# Patient Record
Sex: Male | Born: 1980 | Race: White | Hispanic: No | Marital: Married | Smoking: Light tobacco smoker
Health system: Southern US, Community
[De-identification: ages and names within clinical notes are randomized; demographics above are authoritative.]

## PROBLEM LIST (undated history)

## (undated) DIAGNOSIS — G43909 Migraine, unspecified, not intractable, without status migrainosus: Secondary | ICD-10-CM

---

## 2015-07-29 ENCOUNTER — Encounter (HOSPITAL_COMMUNITY): Payer: Self-pay

## 2015-07-29 ENCOUNTER — Emergency Department (HOSPITAL_COMMUNITY)
Admission: EM | Admit: 2015-07-29 | Discharge: 2015-07-29 | Disposition: A | Discharge status: Home / Self Care | Payer: Medicaid Other | Attending: Emergency Medicine | Admitting: Emergency Medicine

## 2015-07-29 DIAGNOSIS — S0502XA Injury of conjunctiva and corneal abrasion without foreign body, left eye, initial encounter: Secondary | ICD-10-CM | POA: Diagnosis not present

## 2015-07-29 DIAGNOSIS — Y9389 Activity, other specified: Secondary | ICD-10-CM | POA: Insufficient documentation

## 2015-07-29 DIAGNOSIS — Y998 Other external cause status: Secondary | ICD-10-CM | POA: Insufficient documentation

## 2015-07-29 DIAGNOSIS — W228XXA Striking against or struck by other objects, initial encounter: Secondary | ICD-10-CM | POA: Insufficient documentation

## 2015-07-29 DIAGNOSIS — Y9289 Other specified places as the place of occurrence of the external cause: Secondary | ICD-10-CM | POA: Diagnosis not present

## 2015-07-29 DIAGNOSIS — S0592XA Unspecified injury of left eye and orbit, initial encounter: Secondary | ICD-10-CM | POA: Diagnosis present

## 2015-07-29 MED ORDER — FLUORESCEIN SODIUM 1 MG OP STRP
1.0000 | ORAL_STRIP | Freq: Once | OPHTHALMIC | Status: DC
  Filled 2015-07-29: qty 1

## 2015-07-29 MED ORDER — OXYCODONE-ACETAMINOPHEN 5-325 MG PO TABS
1.0000 | ORAL_TABLET | Freq: Once | ORAL | Status: DC
  Filled 2015-07-29: qty 1

## 2015-07-29 MED ORDER — TETRACAINE HCL 0.5 % OP SOLN
2.0000 [drp] | Freq: Once | OPHTHALMIC | Status: AC
  Administered 2015-07-29: 2 [drp] via OPHTHALMIC
  Filled 2015-07-29: qty 2

## 2015-07-29 MED ORDER — ERYTHROMYCIN 5 MG/GM OP OINT
1.0000 "application " | TOPICAL_OINTMENT | Freq: Three times a day (TID) | OPHTHALMIC | Status: DC
  Administered 2015-07-29: 1 via OPHTHALMIC
  Filled 2015-07-29: qty 3.5

## 2015-07-29 NOTE — Discharge Instructions (Signed)
Corneal Abrasion °The cornea is the clear covering at the front and center of the eye. When looking at the colored portion of the eye (iris), you are looking through the cornea. This very thin tissue is made up of many layers. The surface layer is a single layer of cells (corneal epithelium) and is one of the most sensitive tissues in the body. If a scratch or injury causes the corneal epithelium to come off, it is called a corneal abrasion. If the injury extends to the tissues below the epithelium, the condition is called a corneal ulcer. °CAUSES  °· Scratches. °· Trauma. °· Foreign body in the eye. °Some people have recurrences of abrasions in the area of the original injury even after it has healed (recurrent erosion syndrome). Recurrent erosion syndrome generally improves and goes away with time. °SYMPTOMS  °· Eye pain. °· Difficulty or inability to keep the injured eye open. °· The eye becomes very sensitive to light. °· Recurrent erosions tend to happen suddenly, first thing in the morning, usually after waking up and opening the eye. °DIAGNOSIS  °Your health care provider can diagnose a corneal abrasion during an eye exam. Dye is usually placed in the eye using a drop or a small paper strip moistened by your tears. When the eye is examined with a special light, the abrasion shows up clearly because of the dye. °TREATMENT  °· Small abrasions may be treated with antibiotic drops or ointment alone. °· A pressure patch may be put over the eye. If this is done, follow your doctor's instructions for when to remove the patch. Do not drive or use machines while the eye patch is on. Judging distances is hard to do with a patch on. °If the abrasion becomes infected and spreads to the deeper tissues of the cornea, a corneal ulcer can result. This is serious because it can cause corneal scarring. Corneal scars interfere with light passing through the cornea and cause a loss of vision in the involved eye. °HOME CARE  INSTRUCTIONS °· Use medicine or ointment as directed. Only take over-the-counter or prescription medicines for pain, discomfort, or fever as directed by your health care provider. °· Do not drive or operate machinery if your eye is patched. Your ability to judge distances is impaired. °· If your health care provider has given you a follow-up appointment, it is very important to keep that appointment. Not keeping the appointment could result in a severe eye infection or permanent loss of vision. If there is any problem keeping the appointment, let your health care provider know. °SEEK MEDICAL CARE IF:  °· You have pain, light sensitivity, and a scratchy feeling in one eye or both eyes. °· Your pressure patch keeps loosening up, and you can blink your eye under the patch after treatment. °· Any kind of discharge develops from the eye after treatment or if the lids stick together in the morning. °· You have the same symptoms in the morning as you did with the original abrasion days, weeks, or months after the abrasion healed. °MAKE SURE YOU:  °· Understand these instructions. °· Will watch your condition. °· Will get help right away if you are not doing well or get worse. °Document Released: 10/10/2000 Document Revised: 10/18/2013 Document Reviewed: 06/20/2013 °ExitCare® Patient Information ©2015 [employer]. This information is not intended to replace advice given to you by your health care provider. Make sure you discuss any questions you have with your health care provider. ° °

## 2015-07-29 NOTE — ED Provider Notes (Signed)
CSN: [id number]     Arrival date & time 07/29/15  1959 History   First MD Initiated Contact with Patient 07/29/15 2007     Chief Complaint  Patient presents with  . Eye Injury     (Consider location/radiation/quality/duration/timing/severity/associated sxs/prior Treatment) HPI Comments: Pt states that he was stringing a guitar and the string popped and now he is having pain, tearing and vision seems a little blurry. Doesn't wear corrective lenses.  Patient is a 34 y.o. male presenting with eye injury. The history is provided by the patient. No language interpreter was used.  Eye Injury This is a new problem. The current episode started today. The problem occurs constantly. The problem has been unchanged. Pertinent negatives include no fever or headaches. Exacerbated by: light. He has tried nothing for the symptoms.    History reviewed. No pertinent past medical history. History reviewed. No pertinent past surgical history. No family history on file. Social History  Substance Use Topics  . Smoking status: Never Smoker   . Smokeless tobacco: None  . Alcohol Use: Yes     Comment: socially    Review of Systems  Constitutional: Negative for fever.  Neurological: Negative for headaches.  All other systems reviewed and are negative.     Allergies  Review of patient's allergies indicates no known allergies.  Home Medications   Prior to Admission medications   Not on File   BP 122/86 mmHg  Pulse 88  Temp(Src) 97.9 F (36.6 C) (Oral)  Resp 16  Ht  (1.854 m)  Wt 236 lb 1.6 oz (107.094 kg)  BMI 31.16 kg/m2  SpO2 98% Physical Exam  Constitutional: He is oriented to person, place, and time. He appears well-developed and well-nourished.  HENT:  Head: Normocephalic.  Eyes: Conjunctivae and EOM are normal. Pupils are equal, round, and reactive to light.  Slit lamp exam:      The left eye shows corneal abrasion and fluorescein uptake.    Cardiovascular: Normal rate  and regular rhythm.   Pulmonary/Chest: Breath sounds normal.  Musculoskeletal: Normal range of motion.  Neurological: He is alert and oriented to person, place, and time.  Nursing note and vitals reviewed.   ED Course  Procedures (including critical care time) Labs Review Labs Reviewed - No data to display  Imaging Review No results found. I have personally reviewed and evaluated these images and lab results as part of my medical decision-making.   EKG Interpretation None      MDM   Final diagnoses:  Corneal abrasion, left, initial encounter    Exam consistent with corneal abrasion. Pt given erythromycin here. Pt given optho follow up as needed.   Teressa Lower, NP 07/29/15 [id number]  Pricilla Loveless, MD 07/30/15 0003

## 2015-07-29 NOTE — ED Notes (Signed)
Pt was restringing a guitar and a string popped back up and hit him in his left eye. Happened about 1815. Has had ice on it and it has started getting blurry and swollen.

## 2015-07-29 NOTE — ED Notes (Signed)
See fnps notes she saw before myself  Pt alert  No distress

## 2017-03-25 ENCOUNTER — Emergency Department (HOSPITAL_COMMUNITY): Payer: 59

## 2017-03-25 ENCOUNTER — Encounter (HOSPITAL_COMMUNITY): Payer: Self-pay | Admitting: Emergency Medicine

## 2017-03-25 ENCOUNTER — Emergency Department (HOSPITAL_COMMUNITY)
Admission: EM | Admit: 2017-03-25 | Discharge: 2017-03-25 | Disposition: A | Discharge status: Home / Self Care | Payer: 59 | Attending: Emergency Medicine | Admitting: Emergency Medicine

## 2017-03-25 DIAGNOSIS — R1032 Left lower quadrant pain: Secondary | ICD-10-CM | POA: Diagnosis present

## 2017-03-25 DIAGNOSIS — N433 Hydrocele, unspecified: Secondary | ICD-10-CM

## 2017-03-25 DIAGNOSIS — I861 Scrotal varices: Secondary | ICD-10-CM | POA: Insufficient documentation

## 2017-03-25 DIAGNOSIS — R300 Dysuria: Secondary | ICD-10-CM | POA: Diagnosis not present

## 2017-03-25 HISTORY — DX: Migraine, unspecified, not intractable, without status migrainosus: G43.909

## 2017-03-25 LAB — COMPREHENSIVE METABOLIC PANEL
ALK PHOS: 56 U/L (ref 38–126)
ALT: 34 U/L (ref 17–63)
ANION GAP: 9 (ref 5–15)
AST: 29 U/L (ref 15–41)
Albumin: 4.9 g/dL (ref 3.5–5.0)
BILIRUBIN TOTAL: 0.6 mg/dL (ref 0.3–1.2)
BUN: 14 mg/dL (ref 6–20)
CO2: 28 mmol/L (ref 22–32)
Calcium: 9.5 mg/dL (ref 8.9–10.3)
Chloride: 101 mmol/L (ref 101–111)
Creatinine, Ser: 1.07 mg/dL (ref 0.61–1.24)
GFR calc Af Amer: >60 mL/min (ref >60)
GFR calc non Af Amer: >60 mL/min (ref >60)
GLUCOSE: 95 mg/dL (ref 65–99)
POTASSIUM: 4.3 mmol/L (ref 3.5–5.1)
Sodium: 138 mmol/L (ref 135–145)
TOTAL PROTEIN: 7.9 g/dL (ref 6.5–8.1)

## 2017-03-25 LAB — URINALYSIS, ROUTINE W REFLEX MICROSCOPIC
BACTERIA UA: NONE SEEN
BILIRUBIN URINE: NEGATIVE
Glucose, UA: 50 mg/dL — AB
KETONES UR: NEGATIVE mg/dL
LEUKOCYTES UA: NEGATIVE
NITRITE: NEGATIVE
PROTEIN: NEGATIVE mg/dL
SPECIFIC GRAVITY, URINE: 1.004 — AB (ref 1.005–1.030)
Squamous Epithelial / LPF: NONE SEEN
pH: 6 (ref 5.0–8.0)

## 2017-03-25 LAB — CBC
HEMATOCRIT: 45.9 % (ref 39.0–52.0)
HEMOGLOBIN: 16.6 g/dL (ref 13.0–17.0)
MCH: 30.7 pg (ref 26.0–34.0)
MCHC: 36.2 g/dL — AB (ref 30.0–36.0)
MCV: 84.8 fL (ref 78.0–100.0)
Platelets: 261 10*3/uL (ref 150–400)
RBC: 5.41 MIL/uL (ref 4.22–5.81)
RDW: 12.6 % (ref 11.5–15.5)
WBC: 8.6 10*3/uL (ref 4.0–10.5)

## 2017-03-25 LAB — LIPASE, BLOOD: LIPASE: 22 U/L (ref 11–51)

## 2017-03-25 NOTE — ED Provider Notes (Signed)
Received transfer of care from Fayrene Helperran, [place], PA-C at end of shift pending a renal ultrasound with plan to discharge home with PCP follow-up. Hydrocele and varicocele on testicular US. Normal doppler. Ultrasound renal shows no hydronephrosis or urethral stones. Discussed results and symptomatic management with patient. Discharge home with urology follow up.  Patient was stable and comfortable prior to discharge.     Georgiana ShoreMitchell, Lizzy Hamre B, PA-C 03/25/17 1706    Raeford RazorKohut, Stephen, MD 03/29/17 2048

## 2017-03-25 NOTE — ED Triage Notes (Signed)
Pt c/o lower abdominal pain since yesterday. Pt states pain in LLQ present with urination. Pt reports lower abdomen is tender at rest, last bowel movement this morning, denies diarrhea, denies vomiting, denies blood in urine. [facility] and advised to come to ED for evaluation. Pt also c/o migraine type pain in head with tingling in hands bilaterally.

## 2017-03-25 NOTE — ED Provider Notes (Signed)
[facility]-EMERGENCY DEPT Provider Note   CSN: [id number] Arrival date & time: 03/25/17  1150     History   Chief Complaint Chief Complaint  Patient presents with  . Abdominal Pain  . Dysuria    HPI Alan Bauer is a 36 y.o. male.  HPI   36 year old male with history of migraine Complaint of left groin pain. Patient states for the past 36 hours he has had a dull pain to his left groin. Pain is mild, persistent, with increased discomfort while urinating. Currently rates pain as 2 out of 10. He did notice some mild increase in urine odor. Denies any associated fever, chills or chest pain, short of breath, productive cough, urinary frequency or urgency, penile discharge, hematuria, bowel bladder problem. Have a normal bowel movement yesterday. Has been drinking fluid and cranberry juice to help aid with symptoms. No prior history of kidney stones, STI, or change in sex partners. Furthermore, history of migraine, does endorse mild headache and mild tingling sensation to both hands, he has had this test symptoms with past migraine. Patient went to urgent care today for his symptoms but was recommended to come to ER for further evaluation.    Past Medical History:  Diagnosis Date  . Migraines     There are no active problems to display for this patient.   History reviewed. No pertinent surgical history.     Home Medications    Prior to Admission medications   Not on File    Family History History reviewed. No pertinent family history.  Social History Social History  Substance Use Topics  . Smoking status: Never Smoker  . Smokeless tobacco: Never Used  . Alcohol use Yes     Comment: socially     Allergies   Patient has no known allergies.   Review of Systems Review of Systems  All other systems reviewed and are negative.    Physical Exam Updated Vital Signs BP 136/85 (BP Location: Right Arm)   Pulse 79   Temp 98 F (36.7 C) (Oral)   Resp 14   Ht 6\' 1"  (1.854  m)   Wt 104.3 kg (230 lb)   SpO2 100%   BMI 30.34 kg/m   Physical Exam  Constitutional: He appears well-developed and well-nourished. No distress.  HENT:  Head: Atraumatic.  Eyes: Conjunctivae are normal.  Neck: Neck supple.  No nuchal rigidity  Cardiovascular: Normal rate and regular rhythm.   Pulmonary/Chest: Effort normal and breath sounds normal.  Abdominal: Soft. There is tenderness (Very mild tenderness to left inguinal region without deformity).  No CVA tenderness  Genitourinary:  Genitourinary Comments: Chaperone present during exam. No inguinal lymphadenopathy or inguinal hernia noted. No more circumcised penis review of lesion or rash. Testicles with normal lie bilaterally. Mild tenderness to left testicle on palpation without any swelling or edema. Scrotum is soft, perineum is soft. No rash.  Neurological: He is alert. He has normal strength. No cranial nerve deficit or sensory deficit. GCS eye subscore is 4. GCS verbal subscore is 5. GCS motor subscore is 6.  Skin: No rash noted.  Psychiatric: He has a normal mood and affect.  Nursing note and vitals reviewed.    ED Treatments / Results  Labs (all labs ordered are listed, but only abnormal results are displayed) Labs Reviewed  CBC - Abnormal; Notable for the following:       Result Value   MCHC 36.2 (*)    All other components within normal limits  URINALYSIS,  ROUTINE W REFLEX MICROSCOPIC - Abnormal; Notable for the following:    Color, Urine STRAW (*)    Specific Gravity, Urine 1.004 (*)    Glucose, UA 50 (*)    Hgb urine dipstick SMALL (*)    All other components within normal limits  LIPASE, BLOOD  COMPREHENSIVE METABOLIC PANEL    EKG  EKG Interpretation None       Radiology US Scrotum  Result Date: 03/25/2017 CLINICAL DATA:  Initial evaluation for acute left inguinal, left testicular pain. EXAM: SCROTAL ULTRASOUND DOPPLER ULTRASOUND OF THE TESTICLES TECHNIQUE: Complete ultrasound examination of  the testicles, epididymis, and other scrotal structures was performed. Color and spectral Doppler ultrasound were also utilized to evaluate blood flow to the testicles. COMPARISON:  None. FINDINGS: Right testicle Measurements: 4.6 x 2.5 x 3.9 cm. No mass or microlithiasis visualized. Left testicle Measurements: 4.5 x 2.3 x 3.9 cm. No mass or microlithiasis visualized. Right epididymis:  Normal in size and appearance. Left epididymis: Normal in size and appearance. 2.3 x 2 mm anechoic lesion most consistent with a small cyst. Hydrocele:  Small moderate left-sided hydrocele. Varicocele:  Left-sided varicocele. Pulsed Doppler interrogation of both testes demonstrates normal low resistance arterial and venous waveforms bilaterally. IMPRESSION: 1. Left-sided varicocele. 2. Small to moderate left-sided hydrocele. 3. Otherwise unremarkable testicular ultrasound. Electronically Signed   By: Rise Mu M.D.   On: 03/25/2017 16:10   [place] Art/ven Flow Abd Pelv Doppler  Result Date: 03/25/2017 CLINICAL DATA:  Initial evaluation for acute left inguinal, left testicular pain. EXAM: SCROTAL ULTRASOUND DOPPLER ULTRASOUND OF THE TESTICLES TECHNIQUE: Complete ultrasound examination of the testicles, epididymis, and other scrotal structures was performed. Color and spectral Doppler ultrasound were also utilized to evaluate blood flow to the testicles. COMPARISON:  None. FINDINGS: Right testicle Measurements: 4.6 x 2.5 x 3.9 cm. No mass or microlithiasis visualized. Left testicle Measurements: 4.5 x 2.3 x 3.9 cm. No mass or microlithiasis visualized. Right epididymis:  Normal in size and appearance. Left epididymis: Normal in size and appearance. 2.3 x 2 mm anechoic lesion most consistent with a small cyst. Hydrocele:  Small moderate left-sided hydrocele. Varicocele:  Left-sided varicocele. Pulsed Doppler interrogation of both testes demonstrates normal low resistance arterial and venous waveforms bilaterally. IMPRESSION:  1. Left-sided varicocele. 2. Small to moderate left-sided hydrocele. 3. Otherwise unremarkable testicular ultrasound. Electronically Signed   By: Rise Mu M.D.   On: 03/25/2017 16:10    Procedures Procedures (including critical care time)  Medications Ordered in ED Medications - No data to display   Initial Impression / Assessment and Plan / ED Course  I have reviewed the triage vital signs and the nursing notes.  Pertinent labs & imaging results that were available during my care of the patient were reviewed by me and considered in my medical decision making (see chart for details).     BP 125/83 (BP Location: Right Arm)   Pulse 77   Temp 98 F (36.7 C) (Oral)   Resp 12   Ht 6\' 1"  (1.854 m)   Wt 104.3 kg (230 lb)   SpO2 97%   BMI 30.34 kg/m    Final Clinical Impressions(s) / ED Diagnoses   Final diagnoses:  None    New Prescriptions New Prescriptions   No medications on file   2:33 PM Patient sent here from urgent care for complaint of pain to his left inguinal left testicle region. He does have some mild left testicle tenderness on exam but no obvious sign  concerning for testicular torsion and no obvious signs of hernia. No CVA tenderness or hematuria to suggest kidney stones. Abdominal exam otherwise unremarkable. Patient is well-appearing. His labs are reassuring. Pain medication offer but patient declined. Will obtain ultrasound for further evaluation.   4:17 PM Scrotal ultrasound ordered to rule out potential epididymitis testicular torsion. UA returned showing a small amount of hemoglobin urine dipsticks. Other patient does not have any CVA tenderness is a potential that he may have a small distal kidney stone causing his pain. Will also obtain a renal ultrasound to assess for potential hydronephrosis.  Pt sign out to oncoming provider who will f/u on [place] study and will dispo as appropriate.  Care discussed with Dr. Denton Lank.    Fayrene Helper, PA-C 03/25/17  1628    Cathren Laine, MD 03/26/17 [phone]

## 2017-03-25 NOTE — ED Notes (Signed)
Pt given urinal and encouraged to void.  

## 2017-03-25 NOTE — Discharge Instructions (Signed)
As discussed, follow up with urology outpatient. You may use OTC scrotal support for comfort and Nsaids.  Follow up with your primary care provider. Return if you experience worsening pain or other concerning symptoms in the meantime.

## 2018-09-08 ENCOUNTER — Ambulatory Visit: Payer: 59 | Admitting: Psychiatry

## 2018-09-08 DIAGNOSIS — F4321 Adjustment disorder with depressed mood: Secondary | ICD-10-CM | POA: Diagnosis not present

## 2018-09-09 ENCOUNTER — Encounter: Payer: Self-pay | Admitting: Psychiatry
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
## 2018-09-17 ENCOUNTER — Ambulatory Visit: Payer: 59 | Admitting: Psychiatry

## 2018-09-17 ENCOUNTER — Encounter: Payer: Self-pay | Admitting: Psychiatry

## 2018-09-17 DIAGNOSIS — F4321 Adjustment disorder with depressed mood: Secondary | ICD-10-CM

## 2018-09-17 DIAGNOSIS — F4310 Post-traumatic stress disorder, unspecified: Secondary | ICD-10-CM
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
## 2018-09-28 ENCOUNTER — Ambulatory Visit: Payer: 59 | Admitting: Psychiatry

## 2018-09-28 ENCOUNTER — Encounter: Payer: Self-pay | Admitting: Psychiatry

## 2018-09-28 DIAGNOSIS — F4321 Adjustment disorder with depressed mood: Secondary | ICD-10-CM

## 2018-09-28 NOTE — Progress Notes (Signed)
      [facility] Counselor/Therapist Progress Note  Patient ID: Alan Bauer, MRN: [id number],    Date: 09/28/2018  Time Spent: 52 minutes   Treatment Type: Individual Therapy  Reported Symptoms: Depressed mood, Anxious Mood and Irritability  Mental Status Exam:  Appearance:   Casual and Well Groomed     Behavior:  Appropriate  Motor:  Normal  Speech/Language:   Clear and Coherent  Affect:  Appropriate  Mood:  anxious, irritable and sad  Thought process:  normal  Thought content:    WNL  Sensory/Perceptual disturbances:    WNL  Orientation:  oriented to person, place, time/date and situation  Attention:  Good  Concentration:  Good  Memory:  WNL  Fund of knowledge:   Good  Insight:    Good  Judgment:   Good  Impulse Control:  Good   Risk Assessment: Danger to Self:  No Self-injurious Behavior: No Danger to Others: No Duty to Warn:no Physical Aggression / Violence:No  Access to Firearms a concern: No  Gang Involvement:No   Subjective: The client reports [place], "we felt as guests at a members only club."  They ended up leaving early because their children were not sleeping well and as a result he and his wife are not sleeping well.  The interactions he described with his family seemed awkward and difficult.  There seems to be a narrative that is verbally spoken, "that we all love each other and are proud of each other."  But the reality is the behavior does not match up to that, which is quite confusing for the client.  He finds himself angry, irritated and sad at what he has been experiencing and remembering.  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  He realized that he and his siblings were inconvenient to his father and that what he experienced over Thanksgiving was an extension of  that.  I used eye movement with the client to continue to process through his anger with his dad.  Ultimately we decided that he would write a letter to his dad that he would bring here first for us to begin to process.  I also asked the client to listen to the book, "Nyra CapesJohn Bradshaw on the family" as a way to begin to understand the dysfunctional family process.  The client agreed.  Today he reported that he was, "connecting the dots."  Interventions: Solution-Oriented/Positive Psychology, Eye Movement Desensitization and Reprocessing (EMDR) and Insight-Oriented  Diagnosis:   ICD-10-CM   1. Adjustment disorder with depressed mood F43.21     Plan: Write letter to dad, listen to ChenowethBradshaw book.  Gelene MinkFrederick Lindee Leason, [place]

## 2018-10-06 ENCOUNTER — Ambulatory Visit (INDEPENDENT_AMBULATORY_CARE_PROVIDER_SITE_OTHER): Payer: 59 | Admitting: Psychiatry

## 2018-10-06 ENCOUNTER — Encounter: Payer: Self-pay | Admitting: Psychiatry

## 2018-10-06 DIAGNOSIS — F4321 Adjustment disorder with depressed mood: Secondary | ICD-10-CM | POA: Diagnosis not present

## 2018-10-06 NOTE — Progress Notes (Signed)
      [facility] Counselor/Therapist Progress Note  Patient ID: Alan Bauer, MRN: [id number],    Date: 10/06/2018  Time Spent: 58 minutes   Treatment Type: Individual Therapy  Reported Symptoms: Depressed mood and Irritability  Mental Status Exam:  Appearance:   Casual and Well Groomed     Behavior:  Appropriate  Motor:  Normal  Speech/Language:   Clear and Coherent  Affect:  Appropriate  Mood:  irritable and sad  Thought process:  normal  Thought content:    WNL  Sensory/Perceptual disturbances:    WNL  Orientation:  oriented to person, place, time/date and situation  Attention:  Good  Concentration:  Good  Memory:  WNL  Fund of knowledge:   Good  Insight:    Good  Judgment:   Good  Impulse Control:  Good   Risk Assessment: Danger to Self:  No Self-injurious Behavior: No Danger to Others: No Duty to Warn:no Physical Aggression / Violence:No  Access to Firearms a concern: No  Gang Involvement:No   Subjective: Client states that he had a conversation with his mom recently at her house.  She gave him a gift for his youngest son.  After the fact he questioned, "why did not she bring it over to the house herself?  She only lives a mile from us."  This seems to encapsulate the clients position in the family.  He always feels like the odd man out.  Today we focused on his frustration with his family and the fact that they will not make the effort with him.  As the client processed he asked him self the question, "Am I even valued?  Am I seen?  Am I respected?"  As he continued to process this with eye movement he became very, very sad.  His subjective units of distress was a 7.  As he continued to move through this he realized that he was not very connected to his parents.  It was not that he hated them he just felt indifferent.  He realized this was even more difficult.  As he continued to process his sadness began to wane.  He realized that his sadness was legitimate and valid  and it was okay for him to feel.  The client realized that he has an astute intelligence when it comes to emotional relationships.  His positive cognition at the end of the session was, "I can trust my judgment."  He will continue to work on the letter to his dad as well as process and think about these things with his friends.  The client subjective units of distress at the end was less than 2.  Interventions: Solution-Oriented/Positive Psychology, Eye Movement Desensitization and Reprocessing (EMDR) and Insight-Oriented  Diagnosis:   ICD-10-CM   1. Adjustment disorder with depressed mood F43.21     Plan: Journal, self-care, positive self talk.  Alan Bauer, [place]

## 2018-10-28 ENCOUNTER — Ambulatory Visit (INDEPENDENT_AMBULATORY_CARE_PROVIDER_SITE_OTHER): Payer: 59 | Admitting: Psychiatry

## 2018-10-28 ENCOUNTER — Encounter: Payer: Self-pay | Admitting: Psychiatry

## 2018-10-28 DIAGNOSIS — F4321 Adjustment disorder with depressed mood: Secondary | ICD-10-CM
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
## 2018-11-22 ENCOUNTER — Ambulatory Visit (INDEPENDENT_AMBULATORY_CARE_PROVIDER_SITE_OTHER): Payer: 59 | Admitting: Psychiatry

## 2018-11-22 ENCOUNTER — Encounter: Payer: Self-pay | Admitting: Psychiatry

## 2018-11-22 DIAGNOSIS — F4321 Adjustment disorder with depressed mood: Secondary | ICD-10-CM

## 2018-11-22 NOTE — Progress Notes (Signed)
      [facility] Counselor/Therapist Progress Note  Patient ID: Alan Bauer, MRN: [id number],    Date: 11/22/2018  Time Spent: 50 minutes   Treatment Type: Individual Therapy  Reported Symptoms: Irritability  Mental Status Exam:  Appearance:   Casual     Behavior:  Appropriate  Motor:  Normal  Speech/Language:   Clear and Coherent  Affect:  Appropriate  Mood:  irritable and sad  Thought process:  normal  Thought content:    WNL  Sensory/Perceptual disturbances:    WNL  Orientation:  oriented to person, place, time/date and situation  Attention:  Good  Concentration:  Good  Memory:  WNL  Fund of knowledge:   Good  Insight:    Good  Judgment:   Good  Impulse Control:  Good   Risk Assessment: Danger to Self:  No Self-injurious Behavior: No Danger to Others: No Duty to Warn:no Physical Aggression / Violence:No  Access to Firearms a concern: No  Gang Involvement:No   Subjective: The client states he has been reading, "the family by Nyra Capes."  It has given him food for thought on how to his family has been so dysfunctional.  He sees that his dad has constructed a narrative about the family that they are these great missionary people that do everything right.  The client sees this as the family trance and understands that what is truly going on is a lot of codependent dysfunction with mind reading interpretation and fortune telling. Today we reviewed a handout on thinking errors.  Especially mind reading and fortune telling as well as emotional reasoning.  The client sees this is his main things that his family does.  Reading this today helps him be more aware of his own behavior.  We talked about the developmental stages of his children and how they think.  I described the magical thinking that his 36-year old daughter does which is part of her development.  This was an insight for the client where he was able to understand more effectively how to interact with his daughter in  a way that would not be dysfunctional.  The client will continue to consider his parenting strategies in conjunction with his wife.  He will also continue to read the Nyra Capes book.  Interventions: Assertiveness/Communication, Solution-Oriented/Positive Psychology and Insight-Oriented  Diagnosis:   ICD-10-CM   1. Adjustment disorder with depressed mood F43.21     Plan: Review thinking errors handout.  Alan Bauer, [place]

## 2018-12-08 ENCOUNTER — Encounter: Payer: Self-pay | Admitting: Psychiatry

## 2018-12-08 ENCOUNTER — Ambulatory Visit (INDEPENDENT_AMBULATORY_CARE_PROVIDER_SITE_OTHER): Payer: 59 | Admitting: Psychiatry

## 2018-12-08 DIAGNOSIS — F4321 Adjustment disorder with depressed mood: Secondary | ICD-10-CM | POA: Diagnosis not present

## 2018-12-08 NOTE — Progress Notes (Signed)
      [facility] Counselor/Therapist Progress Note  Patient ID: Alan Bauer, MRN: [id number],    Date: 12/08/2018  Time Spent: 48 minutes   Treatment Type: Individual Therapy  Reported Symptoms: Irritability  Mental Status Exam:  Appearance:   Casual and Well Groomed     Behavior:  Appropriate  Motor:  Normal  Speech/Language:   Clear and Coherent  Affect:  Appropriate  Mood:  irritable  Thought process:  normal  Thought content:    WNL  Sensory/Perceptual disturbances:    WNL  Orientation:  oriented to person, place, time/date and situation  Attention:  Good  Concentration:  Good  Memory:  WNL  Fund of knowledge:   Good  Insight:    Good  Judgment:   Good  Impulse Control:  Good   Risk Assessment: Danger to Self:  No Self-injurious Behavior: No Danger to Others: No Duty to Warn:no Physical Aggression / Violence:No  Access to Firearms a concern: No  Gang Involvement:No   Subjective: The client comes in today stating that, "his fuse is very short."  He states it is been stressful, intense over the last few weeks.  His wife was sick with a stomach bug.  His brother came in from [place] for the missions conference at his church.  And he had to spend time with his extended family this past Sunday. He was reminded again and again how disconnected his family is and how they do not get it.  Today we used EMDR to focus on his irritability and edginess that he feels.  He notes that he feels it mostly in his neck.  His negative cognition is, "I do not have any margin."  His subjective units of distress starts at a 7.  It was a 1+ at the end of the session.  As the client processed he saw there were so much going on with his own family as well as his children.  His wife is been having some spiritual experiences that he has found refreshing.  He is not sure how it all applies to him though.  We discussed that he needs to be okay with being in process.  That as he develops his faith  that it will be a real authentic faith.  He also sees that he needs to be able to carve out time for himself to recharge and take care of himself.  He also needs to guard that kind of time for his wife as well.  We also discussed engaging his children talking about what he is thinking and feeling so that they do not take on responsibility for his feelings.  He notes that he has been doing a better job with that.  His positive cognition at the end of the session was, "I am okay, this is okay."  He will focus on increasing some exercise as well as self-care.   Interventions: Assertiveness/Communication, Solution-Oriented/Positive Psychology, Eye Movement Desensitization and Reprocessing (EMDR) and Insight-Oriented  Diagnosis:   ICD-10-CM   1. Adjustment disorder with depressed mood F43.21     Plan: Exercise, self-care.  Gelene Mink Terese Heier, [place]

## 2018-12-14 ENCOUNTER — Ambulatory Visit (INDEPENDENT_AMBULATORY_CARE_PROVIDER_SITE_OTHER): Payer: 59 | Admitting: Psychiatry

## 2018-12-14 ENCOUNTER — Encounter: Payer: Self-pay | Admitting: Psychiatry

## 2018-12-14 DIAGNOSIS — F4321 Adjustment disorder with depressed mood: Secondary | ICD-10-CM | POA: Diagnosis not present

## 2018-12-14 NOTE — Progress Notes (Signed)
      [facility] Counselor/Therapist Progress Note  Patient ID: Alan Bauer, MRN: [id number],    Date: 12/14/2018  Time Spent: 52 minutes   Treatment Type: Individual Therapy  Reported Symptoms: Sad, frustration, irritability  Mental Status Exam:  Appearance:   Casual and Well Groomed     Behavior:  Appropriate  Motor:  Normal  Speech/Language:   Clear and Coherent  Affect:  Appropriate  Mood:  irritable and sad  Thought process:  normal  Thought content:    WNL  Sensory/Perceptual disturbances:    WNL  Orientation:  oriented to person, place, time/date and situation  Attention:  Good  Concentration:  Good  Memory:  WNL  Fund of knowledge:   Good  Insight:    Good  Judgment:   Good  Impulse Control:  Good   Risk Assessment: Danger to Self:  No Self-injurious Behavior: No Danger to Others: No Duty to Warn:no Physical Aggression / Violence:No  Access to Firearms a concern: No  Gang Involvement:No   Subjective: The client reports that last weekend when normally he would take his children to his parents house, when his wife asked him if he wanted to go he quickly said no.  In retrospect this surprised the client.  "I guess the shift is taking place."  The client states that he does feel free year emotionally which was 1 of his goals.  He noticed that he is less angry overall.  I used EMDR with the client to process some sadness and frustration that he had with his dad.  His subjective units of distress started at a 6.  He ended the session at less than 1.  The client reports that he was texting back and forth with his dad who was traveling in [place] at the time.  He mentions in his text, "he misses me."  The client states, "I do not even know what that means."  We discussed how his parents do say one thing but never follow through with behavior.  We did some brainstorming about what conversation with his dad would look like.  Client wants to go back to the letter  that he wrote and rethink that and write a new letter.  At the end of the session the client felt very even and pleased with his progress.  Interventions: Solution-Oriented/Positive Psychology, Eye Movement Desensitization and Reprocessing (EMDR) and Insight-Oriented  Diagnosis:   ICD-10-CM   1. Adjustment disorder with depressed mood F43.21     Plan: Letter, positive self talk, boundaries, assertiveness.  Gelene Mink Karsten Vaughn, [place]

## 2019-01-13 IMAGING — US US RENAL
1 series · 14 of 25 positions shown · non-contrast
Comparison: None in PACs

CLINICAL DATA: Hematuria

EXAM:
RENAL / URINARY TRACT ULTRASOUND COMPLETE

[Series 1: us renal · 0.26mm/px · 14 of 33 slices shown]
[im 1/33]
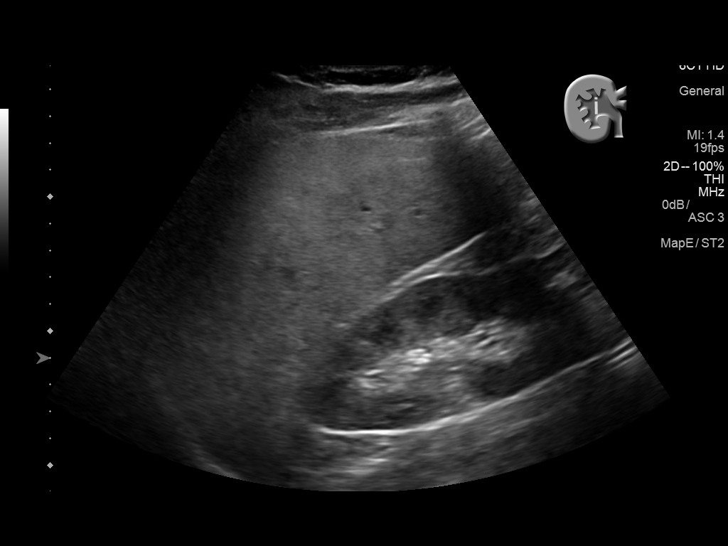
[im 3/33]
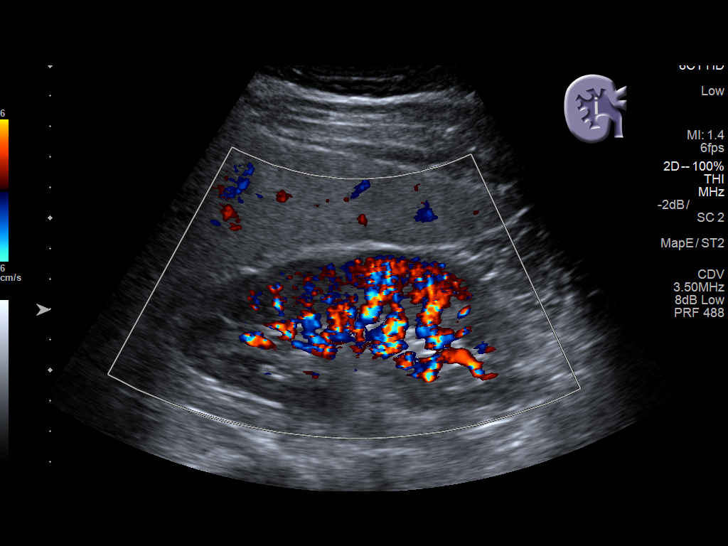
[im 6/33]
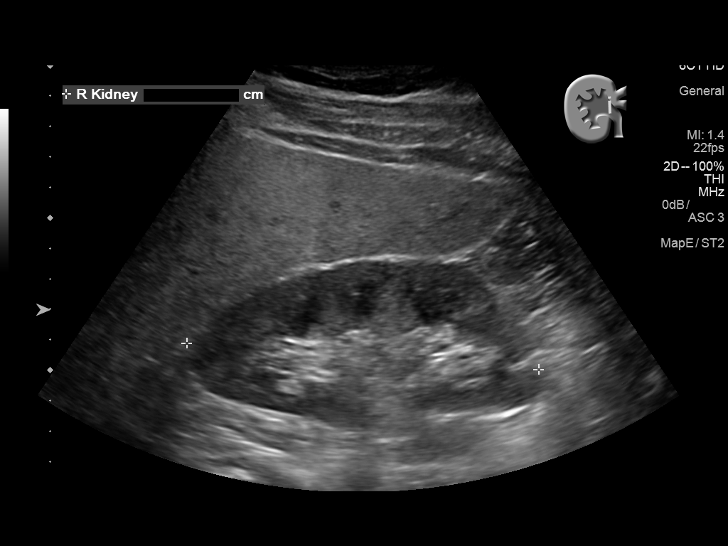
[im 9/33]
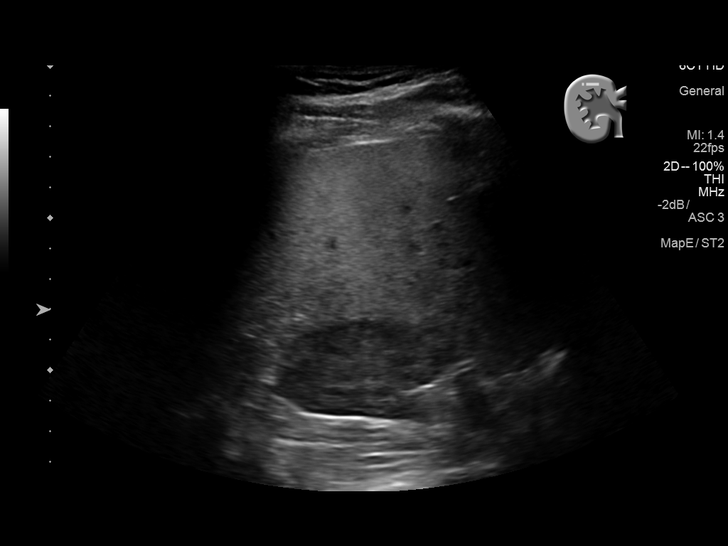
[im 11/33]
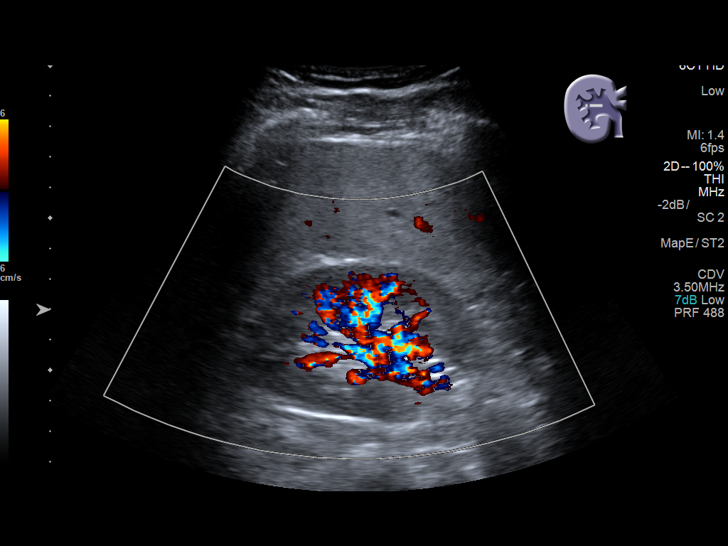
[im 13/33]
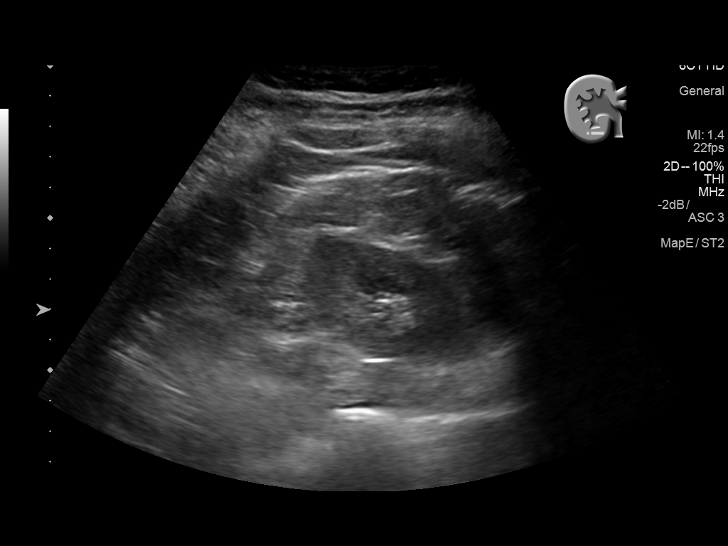
[im 15/33]
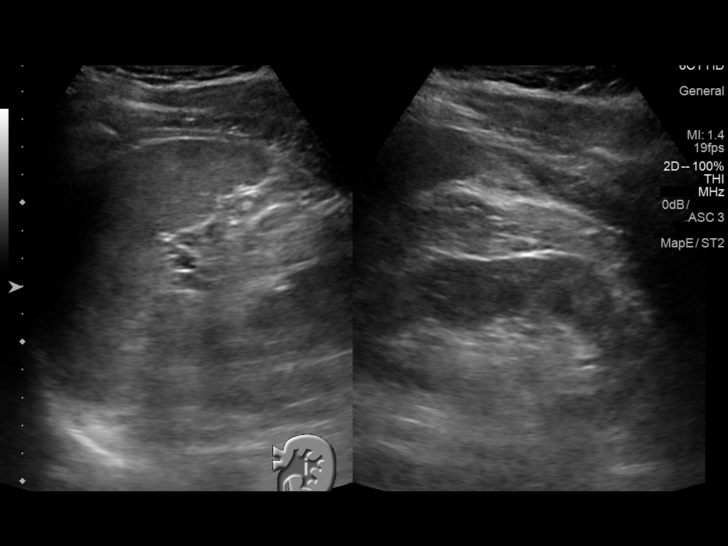
[im 18/33]
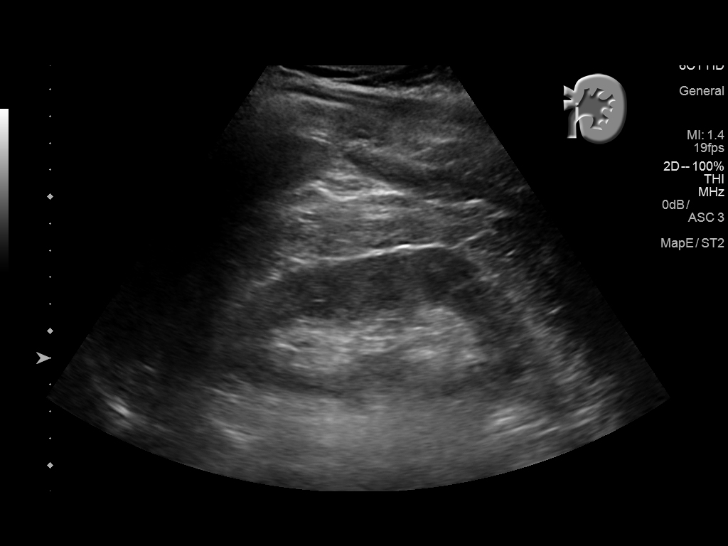
[im 21/33]
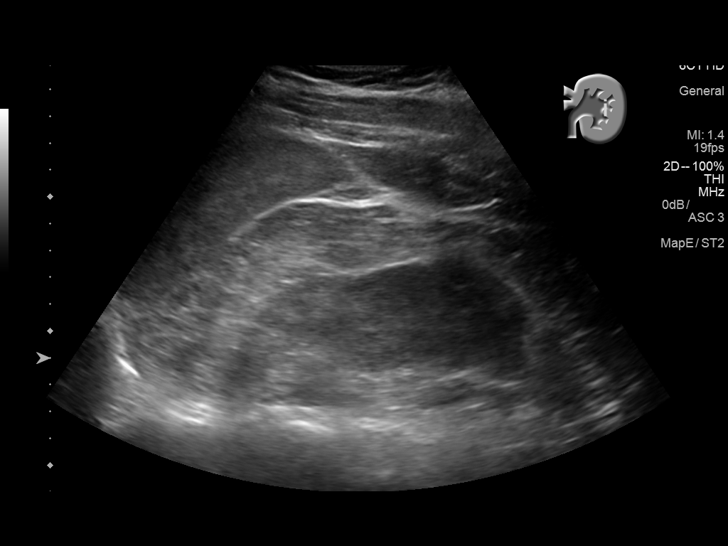
[im 22/33]
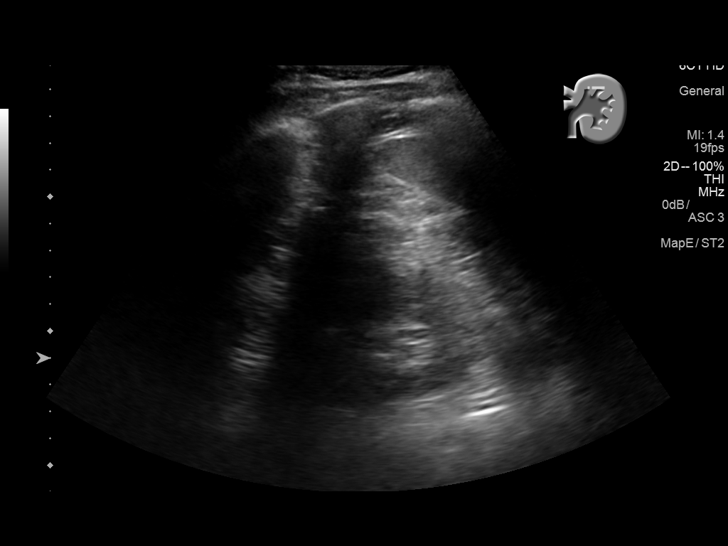
[im 25/33]
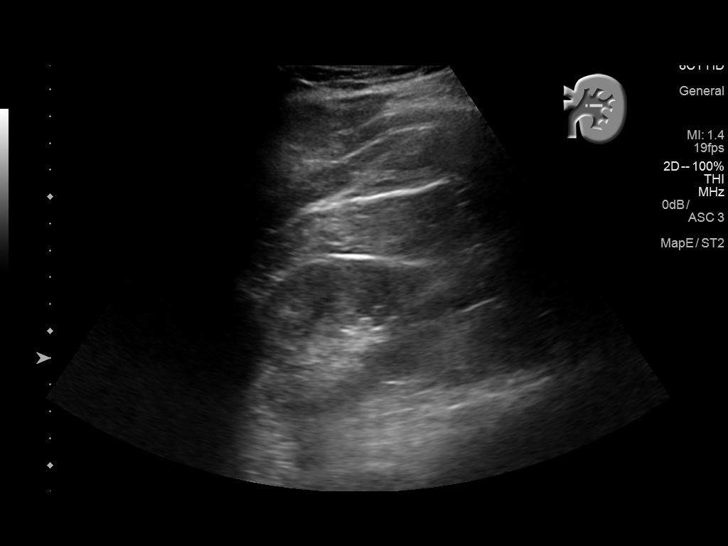
[im 27/33]
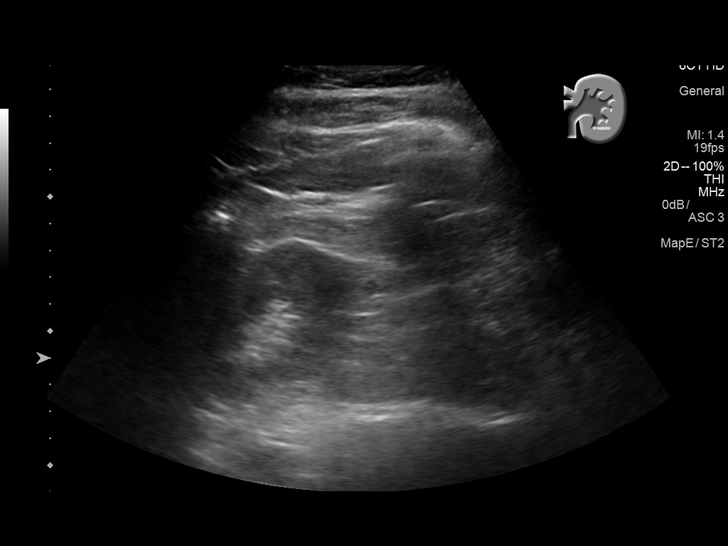
[im 30/33]
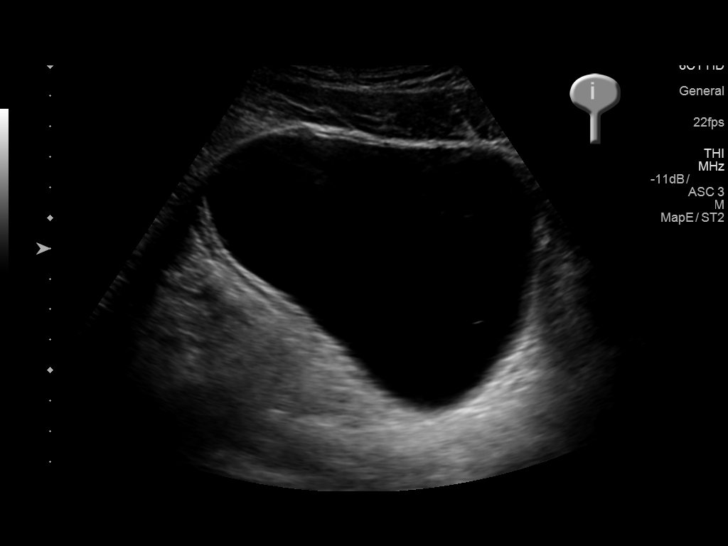
[im 33/33]
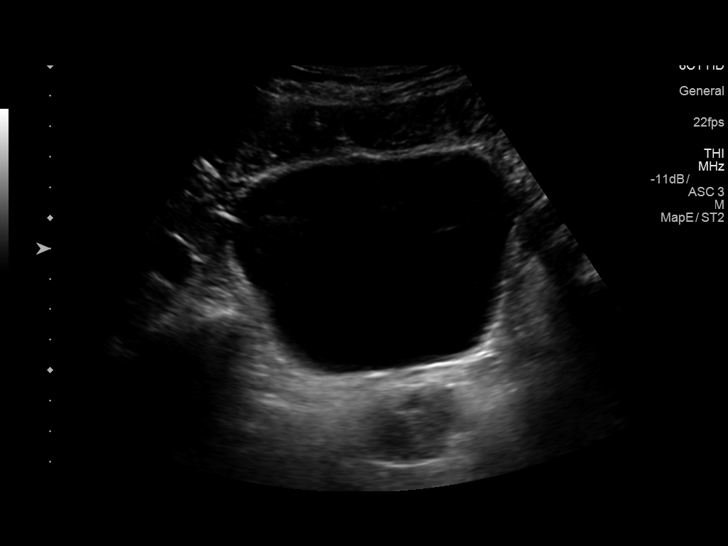

[14 of 25 positions shown; findings below may reference images not displayed]

FINDINGS: Right Kidney:

Length: 11.6 cm. Echogenicity within normal limits. No mass or
hydronephrosis visualized.

Left Kidney:

Length: 11.8 cm. Echogenicity within normal limits. No mass or
hydronephrosis visualized.

Bladder:

The partially distended urinary bladder is normal.
IMPRESSION: There is no hydronephrosis nor cystic or solid renal mass. No
urinary tract stones are observed. Normal appearing urinary bladder.

## 2019-01-13 IMAGING — US US ART/VEN ABD/PELV/SCROTUM DOPPLER LTD
1 series · 14 of 25 positions shown · non-contrast
Comparison: None.

CLINICAL DATA: Initial evaluation for acute left inguinal, left
testicular pain.

EXAM:
SCROTAL ULTRASOUND
DOPPLER ULTRASOUND OF THE TESTICLES
TECHNIQUE: Complete ultrasound examination of the testicles, epididymis, and
other scrotal structures was performed. Color and spectral Doppler
ultrasound were also utilized to evaluate blood flow to the
testicles.

[Series 1: us art/ven abd/pelv/scrotum doppler ltd · 0.06mm/px · 14 of 68 slices shown]
[im 1/68]
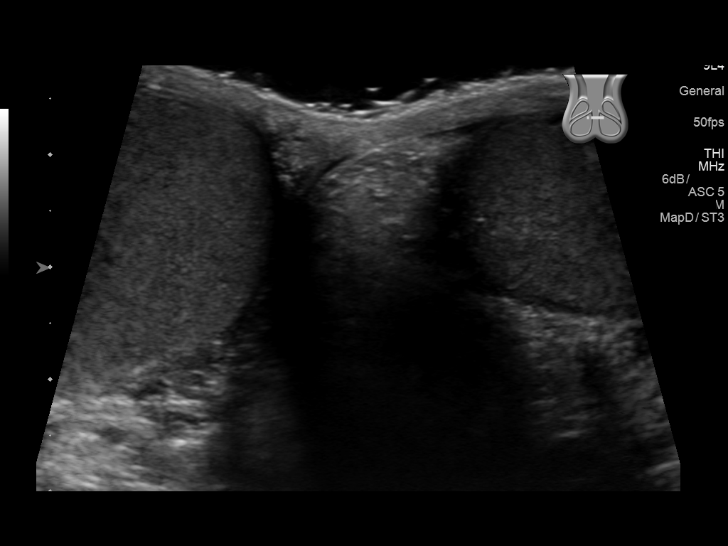
[im 6/68]
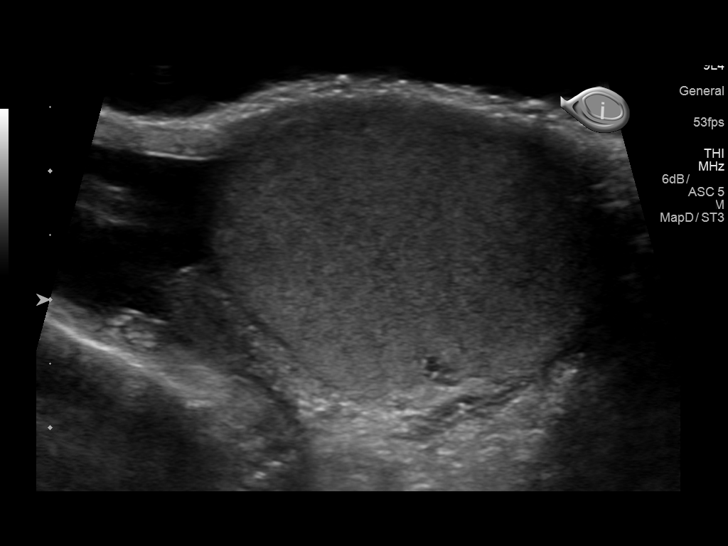
[im 12/68]
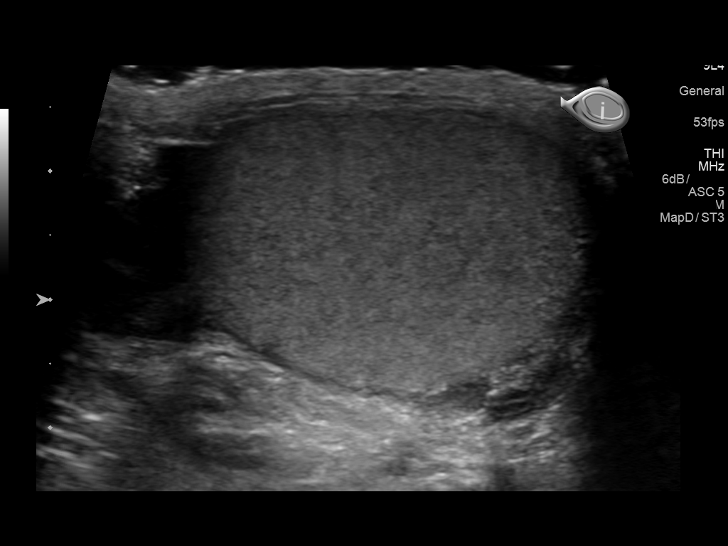
[im 17/68]
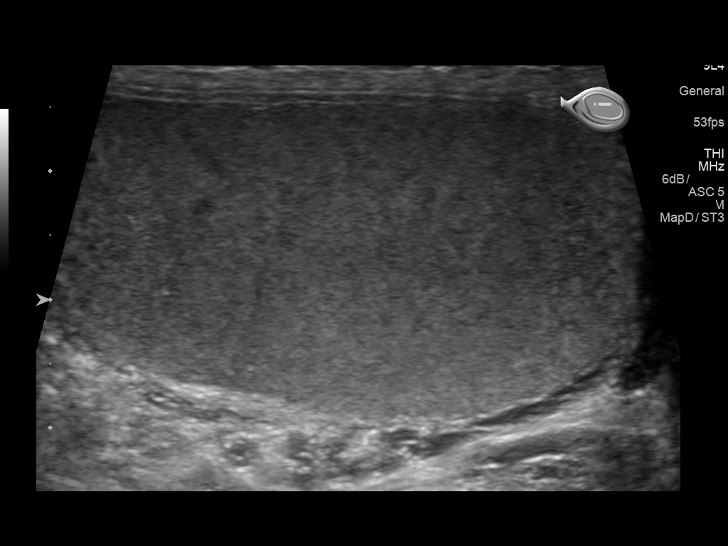
[im 23/68]
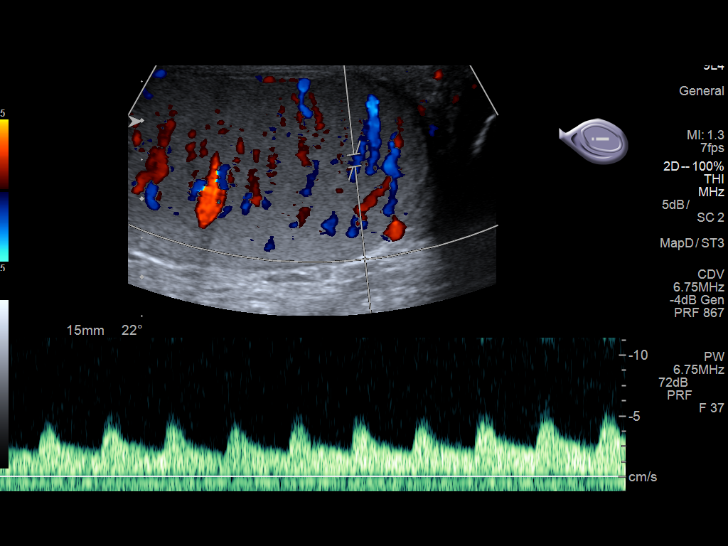
[im 26/68]
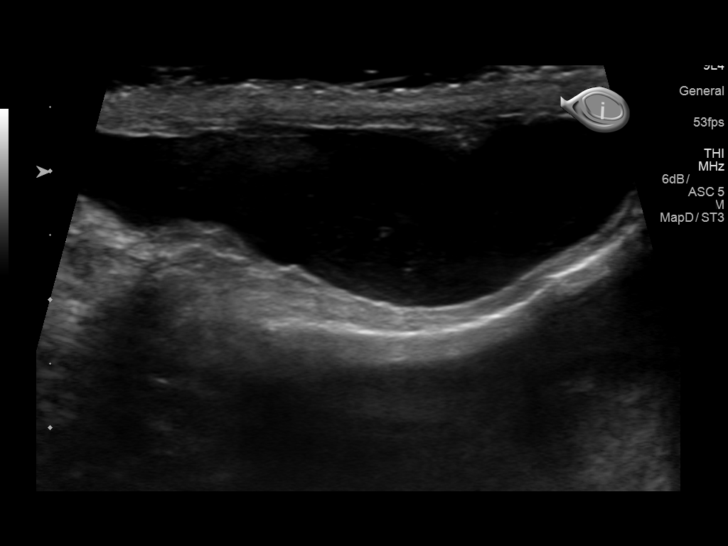
[im 31/68]
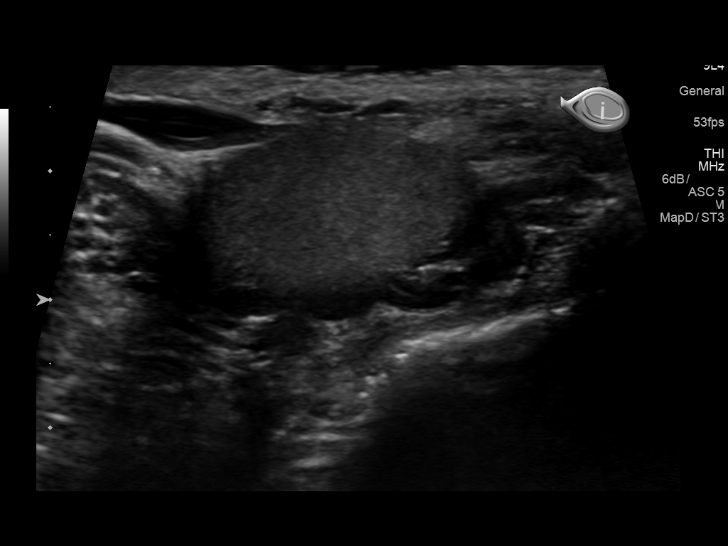
[im 37/68]
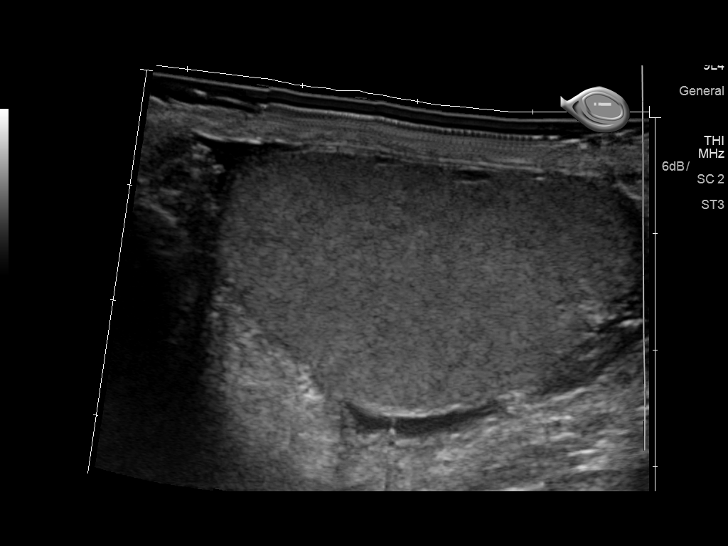
[im 42/68]
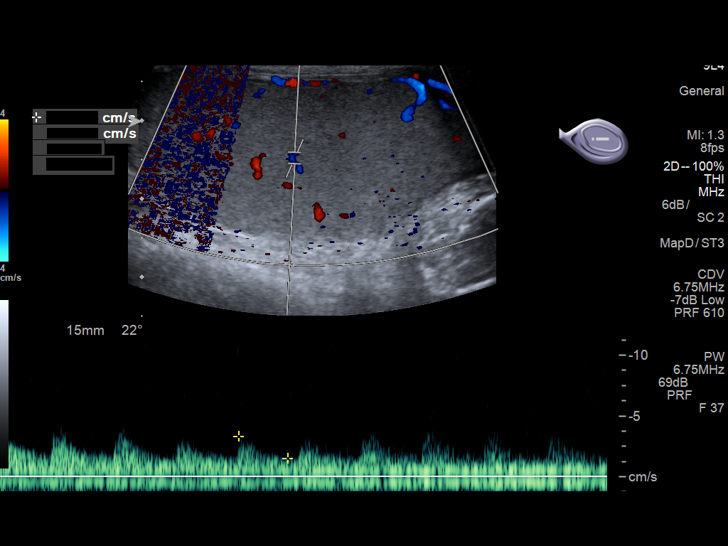
[im 45/68]
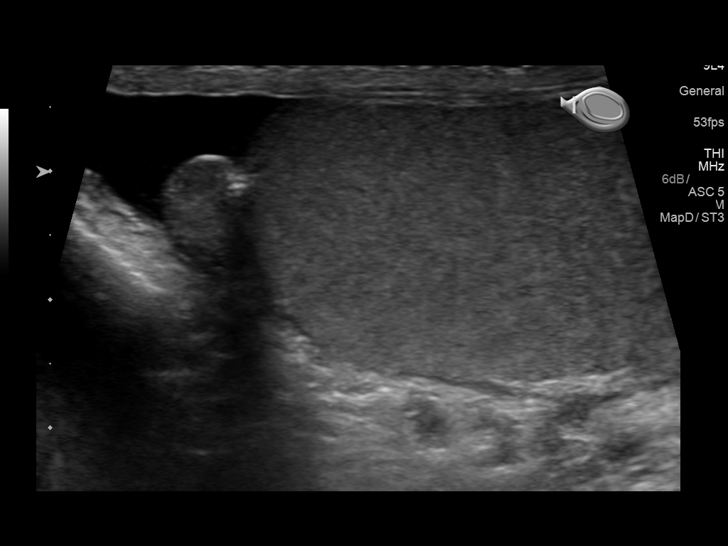
[im 51/68]
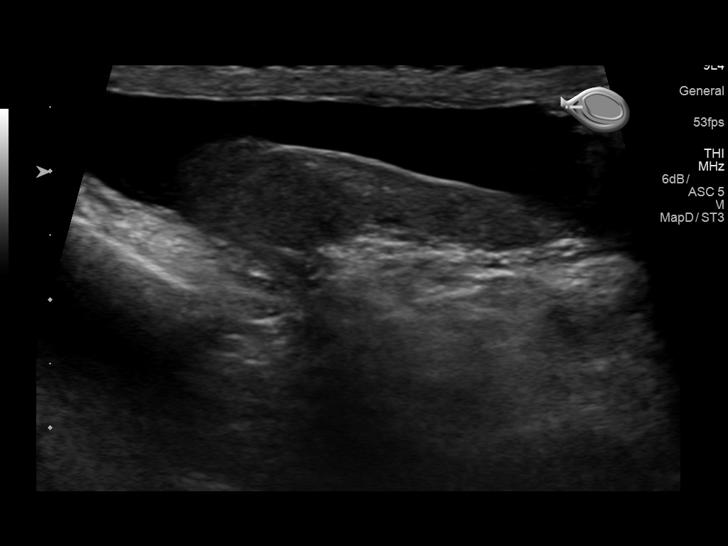
[im 56/68]
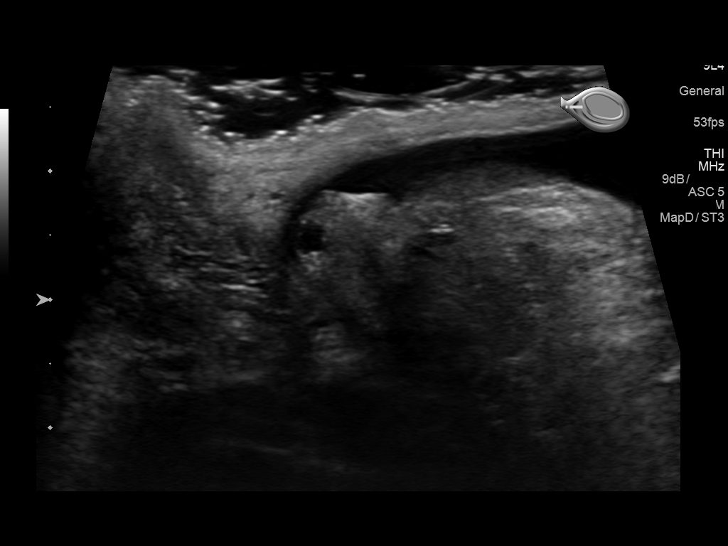
[im 62/68]
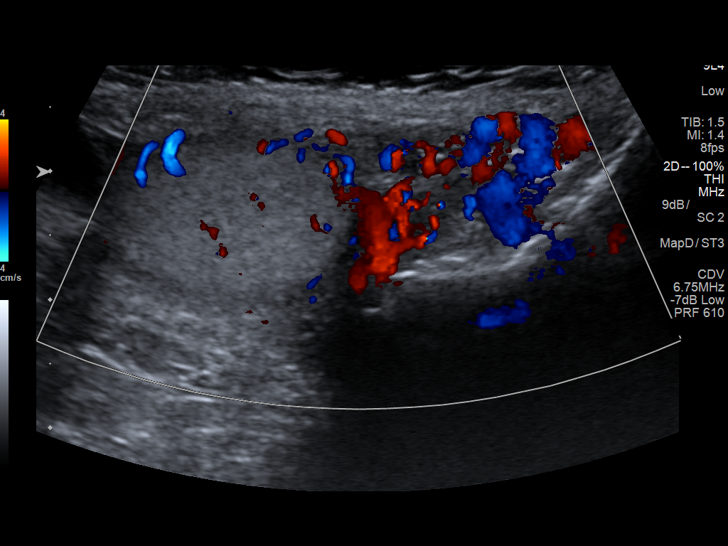
[im 68/68]
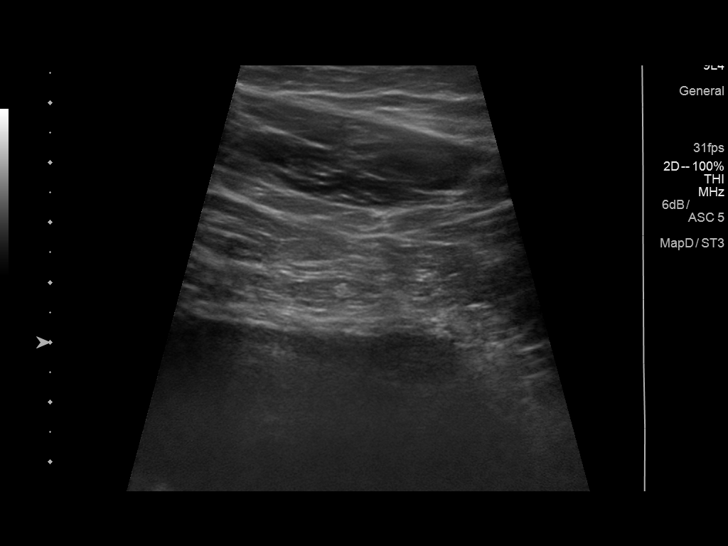

[14 of 25 positions shown; findings below may reference images not displayed]

FINDINGS: Right testicle

Measurements: 4.6 x 2.5 x 3.9 cm. No mass or microlithiasis
visualized.

Left testicle

Measurements: 4.5 x 2.3 x 3.9 cm. No mass or microlithiasis
visualized.

Right epididymis:  Normal in size and appearance.

Left epididymis: Normal in size and appearance. 2.3 x 2 mm anechoic
lesion most consistent with a small cyst.

Hydrocele:  Small moderate left-sided hydrocele.

Varicocele:  Left-sided varicocele.

Pulsed Doppler interrogation of both testes demonstrates normal low
resistance arterial and venous waveforms bilaterally.
IMPRESSION: 1. Left-sided varicocele.
2. Small to moderate left-sided hydrocele.
3. Otherwise unremarkable testicular ultrasound.

## 2019-01-18 ENCOUNTER — Ambulatory Visit: Payer: 59 | Admitting: Psychiatry

## 2019-01-21 ENCOUNTER — Other Ambulatory Visit: Payer: Self-pay

## 2019-01-21 ENCOUNTER — Encounter: Payer: Self-pay | Admitting: Psychiatry

## 2019-01-21 ENCOUNTER — Ambulatory Visit (INDEPENDENT_AMBULATORY_CARE_PROVIDER_SITE_OTHER): Payer: 59 | Admitting: Psychiatry

## 2019-01-21 DIAGNOSIS — F4321 Adjustment disorder with depressed mood: Secondary | ICD-10-CM
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
## 2019-02-14 ENCOUNTER — Ambulatory Visit (INDEPENDENT_AMBULATORY_CARE_PROVIDER_SITE_OTHER): Payer: 59 | Admitting: Psychiatry

## 2019-02-14 ENCOUNTER — Other Ambulatory Visit: Payer: Self-pay

## 2019-02-14 ENCOUNTER — Encounter: Payer: Self-pay | Admitting: Psychiatry

## 2019-02-14 DIAGNOSIS — F4321 Adjustment disorder with depressed mood: Secondary | ICD-10-CM

## 2019-02-14 NOTE — Progress Notes (Signed)
      [facility] Counselor/Therapist Progress Note  Patient ID: Alan Bauer, MRN: [id number],    Date: 02/14/2019  Time Spent: 51 minutes   Treatment Type: Individual Therapy  Reported Symptoms: sad  Mental Status Exam:  Appearance:   Casual     Behavior:  Appropriate  Motor:  Normal  Speech/Language:   Clear and Coherent  Affect:  Appropriate  Mood:  sad  Thought process:  normal  Thought content:    WNL  Sensory/Perceptual disturbances:    WNL  Orientation:  oriented to person, place, time/date and situation  Attention:  Good  Concentration:  Good  Memory:  WNL  Fund of knowledge:   Good  Insight:    Good  Judgment:   Good  Impulse Control:  Good   Risk Assessment: Danger to Self:  No Self-injurious Behavior: No Danger to Others: No Duty to Warn:no Physical Aggression / Violence:No  Access to Firearms a concern: No  Gang Involvement:No   Subjective: I connected with patient by a video enabled telemedicine application or telephone, with their informed consent, and verified patient privacy and that I am speaking with the correct person using two identifiers.  I was located at [facility] and patient at work. The client states that he has been sad since finding out one of his friends in their church community group Alan Bauer die this week.  His friend had gone in for a biopsy on a brain tumor but ended up with an infection.  He is in a coma. The client also has had conversations with his wife.  She states she needs more time to get refreshed and replenished and he needs to be more intentional about that.  He agreed and thought it was a good idea.  He told her he was supportive.  He has been prioritizing his exercise to at least 3 days a week.  This has overall helped his mood. The client is also worried that he Alan Bauer not be engaging his kids as well as he should.  We discussed what that might look like.  I suggested to the client that he consider one time a month to  make "dates" with each of his children separately.  Also to take the opportunity to be verbal with his kids and express his love.  He Alan Bauer feel loving towards them but it does not always get communicated.  He agreed.  Interventions: Assertiveness/Communication, Solution-Oriented/Positive Psychology and Insight-Oriented  Diagnosis:   ICD-10-CM   1. Adjustment disorder with depressed mood F43.21     Plan: Engage children more intentionally, exercise, self care, positive self talk, boundaries.  This record has been created using [employer].  Chart creation errors have been sought, but Alan Bauer not always have been located and corrected. Such creation errors do not reflect on the standard of medical care.  Alan Bauer Alan Bauer, [facility]

## 2019-03-01 ENCOUNTER — Ambulatory Visit (INDEPENDENT_AMBULATORY_CARE_PROVIDER_SITE_OTHER): Payer: 59 | Admitting: Psychiatry

## 2019-03-01 ENCOUNTER — Encounter: Payer: Self-pay | Admitting: Psychiatry

## 2019-03-01 ENCOUNTER — Other Ambulatory Visit: Payer: Self-pay

## 2019-03-01 DIAGNOSIS — F4321 Adjustment disorder with depressed mood: Secondary | ICD-10-CM | POA: Diagnosis not present

## 2019-03-01 NOTE — Progress Notes (Signed)
      [facility] Counselor/Therapist Progress Note  Patient ID: Alan Bauer, MRN: [id number],    Date: 03/01/2019  Time Spent: 51 minutes   Treatment Type: Individual Therapy  Reported Symptoms: irritable, stress  Mental Status Exam:  Appearance:   Casual and Well Groomed     Behavior:  Appropriate  Motor:  Normal  Speech/Language:   Clear and Coherent  Affect:  Appropriate  Mood:  irritable and stress  Thought process:  normal  Thought content:    WNL  Sensory/Perceptual disturbances:    WNL  Orientation:  oriented to person, place, time/date and situation  Attention:  Good  Concentration:  Good  Memory:  WNL  Fund of knowledge:   Good  Insight:    Good  Judgment:   Good  Impulse Control:  Good   Risk Assessment: Danger to Self:  No Self-injurious Behavior: No Danger to Others: No Duty to Warn:no Physical Aggression / Violence:No  Access to Firearms a concern: No  Gang Involvement:No   Subjective: Telehealth visit I connected with patient by a video enabled telemedicine/telehealth application or telephone, with his informed consent, and verified patient privacy and that I am speaking with the correct person using two identifiers.  I was located at my office and patient at his home.  We discussed the limitations, risks, and security and privacy concerns associated with telehealth services and the availability of in-person appointments, including awareness that he Alan Bauer be responsible for charges related to the service, and he expressed understanding and agreed to proceed.  I discussed treatment planning with him, with opportunity to ask and answer all questions. Agreed with the plan, demonstrated an understanding of the instructions, and made him aware to call our office if symptoms worsen or he feels he is in a crisis state and needs immediate contact. The client stated he is doing "okay".  He has had loss of restlessness and realizes that he needs to get away to have some  time on his own.  He does not think that is going to happen now during this pandemic since everything is locked down. His step grandfather recently passed away.  The whole family connected via a zoom call.  He felt that it was awkward and odd.  It brings to the forefront about how dysfunctional his family is.  The client continues to read Alan Bauer, "on the family." Client talked about his spiritual life which is pretty slim right now.  We discussed how he needs to disentangle himself from what he was taught growing up and develop a better paradigm for his Christianity.  He agrees.  He will do some reading to begin to prepare himself for that. He currently finds that he has more intentional about his connection with others and sets better boundaries with his family and people at work.    Interventions: Assertiveness/Communication, Solution-Oriented/Positive Psychology and Insight-Oriented  Diagnosis:   ICD-10-CM   1. Adjustment disorder with depressed mood F43.21     Plan: Assertiveness, boundaries, self-care, exercise, positive self talk  This record has been created using [employer].  Chart creation errors have been sought, but Alan Bauer not always have been located and corrected. Such creation errors do not reflect on the standard of medical care.  Alan Bauer, [name]

## 2019-03-24 ENCOUNTER — Ambulatory Visit (INDEPENDENT_AMBULATORY_CARE_PROVIDER_SITE_OTHER): Payer: 59 | Admitting: Psychiatry

## 2019-03-24 ENCOUNTER — Encounter: Payer: Self-pay | Admitting: Psychiatry

## 2019-03-24 ENCOUNTER — Other Ambulatory Visit: Payer: Self-pay

## 2019-03-24 DIAGNOSIS — F4321 Adjustment disorder with depressed mood: Secondary | ICD-10-CM | POA: Diagnosis not present

## 2019-03-24 NOTE — Progress Notes (Signed)
      [facility] Counselor/Therapist Progress Note  Patient ID: Serena Walloch, MRN: [id number],    Date: 03/24/2019  Time Spent: 50 minutes   Treatment Type: Individual Therapy  Reported Symptoms: frustration, low energy, flat, disconnected.  Mental Status Exam:  Appearance:   Casual     Behavior:  Appropriate  Motor:  Normal  Speech/Language:   Clear and Coherent  Affect:  Appropriate  Mood:  irritable and sad  Thought process:  normal  Thought content:    WNL  Sensory/Perceptual disturbances:    WNL  Orientation:  oriented to person, place, time/date and situation  Attention:  Good  Concentration:  Good  Memory:  WNL  Fund of knowledge:   Good  Insight:    Good  Judgment:   Good  Impulse Control:  Good   Risk Assessment: Danger to Self:  No Self-injurious Behavior: No Danger to Others: No Duty to Warn:no Physical Aggression / Violence:No  Access to Firearms a concern: No  Gang Involvement:No   Subjective: Telehealth visit I connected with patient by a video enabled telemedicine/telehealth application or telephone, with his informed consent, and verified patient privacy and that I am speaking with the correct person using two identifiers.  I was located at my office and patient at his home.  We discussed the limitations, risks, and security and privacy concerns associated with telehealth services and the availability of in-person appointments, including awareness that he Dailen Mcclish be responsible for charges related to the service, and he expressed understanding and agreed to proceed.  I discussed treatment planning with him, with opportunity to ask and answer all questions. Agreed with the plan, demonstrated an understanding of the instructions, and made him aware to call our office if symptoms worsen or he feels he is in a crisis state and needs immediate contact.  The client reports that he has been working with his dad in addition to his full-time job.  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  The last few days the client has been doing repairs and painting to the house locally.  He is clearly tired and out of gas today. "I need to change some things up.  I need to be more engaged, more active."  We discussed today about the client being more intentional in his behavior not only towards his family but for himself.  There needs to be a more regular sleep hygiene schedule.  He agrees that he needs to begin to exercise more.  He also needs to find places where he can carve out some respite in his life to recharge.  He finds that his social network is helpful when he reaches out to it.  Today the client agreed to pursue these things.  He will intentionally make it a priority.  Interventions: Assertiveness/Communication, Motivational Interviewing, Solution-Oriented/Positive Psychology and Insight-Oriented  Diagnosis:   ICD-10-CM   1. Adjustment disorder with depressed mood F43.21     Plan: Self-care, exercise, positive self talk, reach out to social network, intentionality.  This record has been created using [employer].  Chart creation errors have been sought, but Dorothye Berni not always have been located and corrected. Such creation errors do not reflect on the standard of medical care.  Gelene Mink Tamirra Sienkiewicz, [facility]

## 2019-04-11 ENCOUNTER — Other Ambulatory Visit: Payer: Self-pay

## 2019-04-11 ENCOUNTER — Ambulatory Visit (INDEPENDENT_AMBULATORY_CARE_PROVIDER_SITE_OTHER): Payer: 59 | Admitting: Psychiatry

## 2019-04-11 ENCOUNTER — Encounter: Payer: Self-pay | Admitting: Psychiatry

## 2019-04-11 DIAGNOSIS — F4321 Adjustment disorder with depressed mood: Secondary | ICD-10-CM

## 2019-04-11 NOTE — Progress Notes (Signed)
      [facility] Counselor/Therapist Progress Note  Patient ID: Alan Bauer, MRN: [id number],    Date: 04/11/2019  Time Spent: 51 minutes   Treatment Type: Individual Therapy  Reported Symptoms: sad, hopeless, frustration  Mental Status Exam:  Appearance:   Casual and Well Groomed     Behavior:  Appropriate  Motor:  Normal  Speech/Language:   Clear and Coherent  Affect:  Appropriate  Mood:  irritable and sad  Thought process:  normal  Thought content:    WNL  Sensory/Perceptual disturbances:    WNL  Orientation:  oriented to person, place, time/date and situation  Attention:  Good  Concentration:  Good  Memory:  WNL  Fund of knowledge:   Good  Insight:    Good  Judgment:   Good  Impulse Control:  Good   Risk Assessment: Danger to Self:  No Self-injurious Behavior: No Danger to Others: No Duty to Warn:no Physical Aggression / Violence:No  Access to Firearms a concern: No  Gang Involvement:No   Subjective: I met with the client face-to-face.  We both had facemasks. The client reports that he turned 38 last week.  He spent time with his extended family twice last week.  He states, "I was in a funk afterwards."  He states that his interactions with his dad have not gone well.  Even bringing his children over to his parents is difficult for the client.  He feels they are dismissive of his 58-year-old daughter who tries hard to be accepted and loved by them but they only recognize the boys. Today we used eye-movement focusing on the client's hopelessness and frustration that he feels around his dad.  His negative cognition is, "I am just an extension of him."  He feels sadness in his chest.  His subjective units of distress is 7.  As the client processed he remembered that his dad refers to the client as "him 2.0".  This brought home to the client that his dad's ego tends to engulf everything.  He realizes more and more that he will never be loved by his dad in a way that feels  significant.  He states this makes him feel more indifferent to him which makes the client sad.  As we continue to process the client did not have a good grasp of what are the aspects of narcissism and pathological narcissism.  I gave the client a handout to review.  At the end of the session his sadness had not significantly decreased.  He had gained more insight but  He did feel okay leaving the session.  Interventions: Assertiveness/Communication, Motivational Interviewing, Solution-Oriented/Positive Psychology, [employer] [facility] Desensitization and Reprocessing (EMDR) and Insight-Oriented  Diagnosis:   ICD-10-CM   1. Adjustment disorder with depressed mood  F43.21     Plan: Review handout, assertiveness, boundaries, exercise, self-care.  This record has been created using [employer].  Chart creation errors have been sought, but Stuart Guillen not always have been located and corrected. Such creation errors do not reflect on the standard of medical care.  Addysyn Fern, [facility]

## 2019-04-27 ENCOUNTER — Ambulatory Visit (INDEPENDENT_AMBULATORY_CARE_PROVIDER_SITE_OTHER): Payer: 59 | Admitting: Psychiatry

## 2019-04-27 ENCOUNTER — Other Ambulatory Visit: Payer: Self-pay

## 2019-04-27 ENCOUNTER — Encounter: Payer: Self-pay | Admitting: Psychiatry

## 2019-04-27 DIAGNOSIS — F411 Generalized anxiety disorder: Secondary | ICD-10-CM | POA: Diagnosis not present

## 2019-04-27 NOTE — Progress Notes (Signed)
[facility] Counselor/Therapist Progress Note  Patient ID: Kailen Cizek, MRN: [id number],    Date: 04/27/2019  Time Spent: 50 minutes   Treatment Type: Individual Therapy  Reported Symptoms: anxiety  Mental Status Exam:  Appearance:   Casual     Behavior:  Appropriate  Motor:  Normal  Speech/Language:   Clear and Coherent  Affect:  Appropriate  Mood:  anxious  Thought process:  normal  Thought content:    WNL  Sensory/Perceptual disturbances:    WNL  Orientation:  oriented to person, place, time/date and situation  Attention:  Good  Concentration:  Good  Memory:  WNL  Fund of knowledge:   Good  Insight:    Good  Judgment:   Good  Impulse Control:  Good   Risk Assessment: Danger to Self:  No Self-injurious Behavior: No Danger to Others: No Duty to Warn:no Physical Aggression / Violence:No  Access to Firearms a concern: No  Gang Involvement:No   Subjective: I met with the client face-to-face.  We both had facemasks. The client states that his anxiety has been up.  He feels that his become more generalized.  The client is concerned #1 because of the ongoing COVID pandemic and the implications of infection by the virus.  Secondly, the client continues to get paid with his job but his income is not meeting the expectations that he has for his budget.  He states today he was trying to cut his lawn when his next-door neighbor started yelling at him from across the fence.  He is concerned that she is mentally ill and may call the police to do a well check on her. I used eye-movement with the client to process his anxiety that he identified at a subjective units of distress of 4.  He felt it in his chest.  As he processed he began to discuss the boredom that he feels in his current job.  Discussed what would a career path for him look like.  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  We discussed what the nuts and bolts of that looks like.  I suggested to the client to  look into the small business administration programs for for starting a business.  There are options for financing that could be very helpful.  The client clearly brightened and will begin to look into what could be done.  Interventions: Motivational Interviewing, Solution-Oriented/Positive Psychology, Eye Movement Desensitization and Reprocessing (EMDR) and Insight-Oriented  Diagnosis:   ICD-10-CM   1. Adjustment disorder with anxiety  F43.22     Plan: Looking to classes at the small business administration, write a plan, "Cast your net wide and think outside the box."  This record has been created using Dragon software.  Chart creation errors have been sought, but may not always have been located and corrected. Such creation errors do not reflect on the standard of medical care.  Frederick May, LCMH[facility]                  

## 2019-05-19 ENCOUNTER — Encounter: Payer: Self-pay | Admitting: Psychiatry

## 2019-05-19 ENCOUNTER — Ambulatory Visit (INDEPENDENT_AMBULATORY_CARE_PROVIDER_SITE_OTHER): Payer: 59 | Admitting: Psychiatry

## 2019-05-19 ENCOUNTER — Other Ambulatory Visit: Payer: Self-pay

## 2019-05-19 DIAGNOSIS — F411 Generalized anxiety disorder: Secondary | ICD-10-CM

## 2019-05-19 NOTE — Progress Notes (Signed)
      [facility] Counselor/Therapist Progress Note  Patient ID: Alan Bauer, MRN: [id number],    Date: 05/19/2019  Time Spent: 50 minutes   Treatment Type: Individual Therapy  Reported Symptoms: anxious, stressed.  Mental Status Exam:  Appearance:   Casual     Behavior:  Appropriate  Motor:  Normal  Speech/Language:   Clear and Coherent  Affect:  Appropriate  Mood:  anxious  Thought process:  normal  Thought content:    WNL  Sensory/Perceptual disturbances:    WNL  Orientation:  oriented to person, place, time/date and situation  Attention:  Good  Concentration:  Good  Memory:  WNL  Fund of knowledge:   Good  Insight:    Good  Judgment:   Good  Impulse Control:  Good   Risk Assessment: Danger to Self:  No Self-injurious Behavior: No Danger to Others: No Duty to Warn:no Physical Aggression / Violence:No  Access to Firearms a concern: No  Gang Involvement:No   Subjective: I met with the client face-to-face.  We both had facemasks. The client states that his finances are in the tank.  He also discussed the fact that his next-door neighbor seems to be "unhinged".  This neighbor seems to be a source of great anxiety for the client.  Today we used eye-movement in the bilateral stimulation hand paddles with the focus on this client.  His negative thoughts connected to it are, "she is unpredictable.  She sees [place] and ask unhinged.  I am powerless.  I am uncertain."  His subjective units of distress is an 8. As the client processed he asked, "why cannot I do this."  He is not sure why she has so much power over him.  He has so much anxiety he cannot mow his grass in the backyard.  When they are outside she comes out and yells at them using curse words.  He and his wife have never done anything to this woman but feels overwhelmed by her behavior.  He feels a pit of dread in his stomach. In the big picture he knows that he is spiritually in a dark place.  He continues to struggle with  family of origin issues their finances and even the placement of his oldest daughter in school in the fall.  "This is all daunting."  We were able to get his subjective units of distress down to a 5+.  He [name] consider contacting the police to do a wellness check on their neighbor.  Interventions: Assertiveness/Communication, Motivational Interviewing, Solution-Oriented/Positive Psychology, [employer] Group Desensitization and Reprocessing (EMDR) and Insight-Oriented  Diagnosis:   ICD-10-CM   1. Generalized anxiety disorder  F41.1     Plan: Consider a wellness check by the local police, assertiveness, boundaries, self-care, exercise, social network.  This record has been created using [employer].  Chart creation errors have been sought, but Brindy Higginbotham not always have been located and corrected. Such creation errors do not reflect on the standard of medical care.  Cliffton Spradley, [facility]

## 2019-06-06 ENCOUNTER — Other Ambulatory Visit: Payer: Self-pay

## 2019-06-06 ENCOUNTER — Ambulatory Visit (INDEPENDENT_AMBULATORY_CARE_PROVIDER_SITE_OTHER): Payer: 59 | Admitting: Psychiatry

## 2019-06-06 DIAGNOSIS — F411 Generalized anxiety disorder: Secondary | ICD-10-CM | POA: Diagnosis not present

## 2019-06-06 NOTE — Progress Notes (Signed)
      [facility] Counselor/Therapist Progress Note  Patient ID: Alan Bauer, MRN: [id number],    Date: 06/07/2019  Time Spent: 50 minutes   Treatment Type: Individual Therapy  Reported Symptoms: anxious, sad  Mental Status Exam:  Appearance:   Casual     Behavior:  Appropriate  Motor:  Normal  Speech/Language:   Clear and Coherent  Affect:  Appropriate  Mood:  anxious and sad  Thought process:  normal  Thought content:    WNL  Sensory/Perceptual disturbances:    WNL  Orientation:  oriented to person, place, time/date and situation  Attention:  Good  Concentration:  Good  Memory:  WNL  Fund of knowledge:   Good  Insight:    Good  Judgment:   Good  Impulse Control:  Good   Risk Assessment: Danger to Self:  No Self-injurious Behavior: No Danger to Others: No Duty to Warn:no Physical Aggression / Violence:No  Access to Firearms a concern: No  Gang Involvement:No   Subjective: The client reports that he and his wife are in the process of trying to refinance the house.  He has worked to do it each of the bathrooms.  The speed of the renovation has been a point of contention between him and his wife.  She thinks he is moving too slow the client feels like he is trying to be thoughtful. Today the client discussed the fact that he does not always really ask for what he wants.  This is changed most recently.  He had a friend have invested in a coffee roaster that the client will put in his garage.  This is a long-term project and hobby for the client that lifts his spirits.  I had encouraged the client to pursue more hobbies to help his mood.  He has found this helpful. Client has not been as good on exercise.  There are next-door neighbor who is elderly has recently had difficulties becoming psychotic.  The EMS people show up at the door in the middle of the night looking for a key to get into her house.  This is overly stressed the client and his wife. Client continues to work on  self-care, exercise and boundaries and also learning to be more assertive.  Interventions: Assertiveness/Communication, Motivational Interviewing, Solution-Oriented/Positive Psychology, [employer] Group Desensitization and Reprocessing (EMDR) and Insight-Oriented  Diagnosis:   ICD-10-CM   1. Generalized anxiety disorder  F41.1     Plan: Assertiveness, boundaries, mood independent behavior, self-care, exercise.  This record has been created using [employer].  Chart creation errors have been sought, but Alexes Menchaca not always have been located and corrected. Such creation errors do not reflect on the standard of medical care.   Krishan Mcbreen, [facility]

## 2019-06-07 ENCOUNTER — Encounter: Payer: Self-pay | Admitting: Psychiatry

## 2019-06-22 ENCOUNTER — Other Ambulatory Visit: Payer: Self-pay

## 2019-06-22 ENCOUNTER — Ambulatory Visit (INDEPENDENT_AMBULATORY_CARE_PROVIDER_SITE_OTHER): Payer: 59 | Admitting: Psychiatry

## 2019-06-22 ENCOUNTER — Encounter: Payer: Self-pay | Admitting: Psychiatry

## 2019-06-22 DIAGNOSIS — F4321 Adjustment disorder with depressed mood: Secondary | ICD-10-CM | POA: Diagnosis not present

## 2019-06-22 NOTE — Progress Notes (Signed)
      [facility] Counselor/Therapist Progress Note  Patient ID: Alan Bauer, MRN: [id number],    Date: 06/22/2019  Time Spent: 50 minutes   Treatment Type: Individual Therapy  Reported Symptoms: irritability, lack of motivation  Mental Status Exam:  Appearance:   Casual     Behavior:  Appropriate  Motor:  Normal  Speech/Language:   Clear and Coherent  Affect:  Appropriate  Mood:  irritable  Thought process:  normal  Thought content:    WNL  Sensory/Perceptual disturbances:    WNL  Orientation:  oriented to person, place, time/date and situation  Attention:  Good  Concentration:  Good  Memory:  WNL  Fund of knowledge:   Good  Insight:    Good  Judgment:   Good  Impulse Control:  Good   Risk Assessment: Danger to Self:  No Self-injurious Behavior: No Danger to Others: No Duty to Warn:no Physical Aggression / Violence:No  Access to Firearms a concern: No  Gang Involvement:No   Subjective: The client states that he went over the unspoken expectations with his wife.  They were able to clarify a lot of things and are on much better footing.  The client also had an intense conversation with his younger sister about family dynamics.  His sister has begun her own counseling working on her relationship with her mother.  They were able to discuss gender roles and their family and how it has impacted them. The client states he still gets irritated with his dad.  He found when he was talking to his sister about his dad that irritation was coming up.  He would like to address this at next session using the EMDR.   He continues to make positive progress with assertiveness and boundaries.  The client sees how dependent  his behavior has been on his mood.  We discussed overcoming his lack of motivation that he needs to set discipline in place in spite of how he feels.  The client agrees.  Interventions: Assertiveness/Communication, Motivational Interviewing, Solution-Oriented/Positive  Psychology and Insight-Oriented  Diagnosis:   ICD-10-CM   1. Adjustment disorder with depressed mood  F43.21     Plan: Mood independent behavior, assertiveness, boundaries, exercise, self-care.  Alan Bauer, [facility]

## 2019-07-08 ENCOUNTER — Ambulatory Visit (INDEPENDENT_AMBULATORY_CARE_PROVIDER_SITE_OTHER): Payer: 59 | Admitting: Psychiatry

## 2019-07-08 ENCOUNTER — Encounter: Payer: Self-pay | Admitting: Psychiatry

## 2019-07-08 ENCOUNTER — Other Ambulatory Visit: Payer: Self-pay

## 2019-07-08 DIAGNOSIS — F4323 Adjustment disorder with mixed anxiety and depressed mood: Secondary | ICD-10-CM | POA: Diagnosis not present

## 2019-07-08 NOTE — Progress Notes (Signed)
      [facility] Counselor/Therapist Progress Note  Patient ID: Alan Bauer, MRN: [id number],    Date: 07/08/2019  Time Spent: 50 minutes   Treatment Type: Individual Therapy  Reported Symptoms: anxiety, resentment  Mental Status Exam:  Appearance:   Casual     Behavior:  Appropriate  Motor:  Normal  Speech/Language:   Clear and Coherent  Affect:  Appropriate  Mood:  anxious and irritable  Thought process:  normal  Thought content:    WNL  Sensory/Perceptual disturbances:    WNL  Orientation:  oriented to person, place, time/date and situation  Attention:  Good  Concentration:  Good  Memory:  WNL  Fund of knowledge:   Good  Insight:    Good  Judgment:   Good  Impulse Control:  Good   Risk Assessment: Danger to Self:  No Self-injurious Behavior: No Danger to Others: No Duty to Warn:no Physical Aggression / Violence:No  Access to Firearms a concern: No  Gang Involvement:No   Subjective: The client states that his wife is recently gotten a job as a Clinical cytogeneticist.  He states they will have to rethink their schedule at home especially with their oldest child in virtual school. Client has started thinking about whether or not he will spend Thanksgiving with his parents at the beach.  He sees last year as a fiasco that he does not want to repeat.  We discussed some different options such as meeting with his siblings locally at the mother-in-law's house.  The client will consider these things. I used eye-movement with the client around his relationship with his wife.  When he approaches her to do an activity outside the house for himself he finds himself becoming anxious.  His negative thought is, "I will be rejected."  He feels anxiety in his chest is subjective units of distress is a 4+.  As the client processed he became more anxious and more concerned.  He feels that he does what he can to accommodate her but finds that she does not return the  favor all the time.  We talked about reciprocity and the necessity for that.  Client agrees that he needs to have a discussion concerning that.  We discussed using the anger model of communication to address these issues.  I encouraged the client to journal out his thoughts using the model.  Then he can either give that to his wife and then discuss it or use it as talking points for himself.  The client agreed to try to do so.  His positive cognition at the end of the session was, "I do have rights."  Interventions: Assertiveness/Communication, Motivational Interviewing, Solution-Oriented/Positive Psychology, [employer] Desensitization and Reprocessing (EMDR) and Insight-Oriented  Diagnosis:   ICD-10-CM   1. Adjustment disorder with mixed anxiety and depressed mood  F43.23     Plan: Journal, positive self talk, assertive communication, boundaries, clarification model of anger review handout.  Dyonna Jaspers, [facility]

## 2019-07-27 ENCOUNTER — Ambulatory Visit (INDEPENDENT_AMBULATORY_CARE_PROVIDER_SITE_OTHER): Payer: 59 | Admitting: Psychiatry

## 2019-07-27 ENCOUNTER — Other Ambulatory Visit: Payer: Self-pay

## 2019-07-27 ENCOUNTER — Encounter: Payer: Self-pay | Admitting: Psychiatry

## 2019-07-27 DIAGNOSIS — F4323 Adjustment disorder with mixed anxiety and depressed mood: Secondary | ICD-10-CM

## 2019-07-27 NOTE — Progress Notes (Signed)
      [facility] Counselor/Therapist Progress Note  Patient ID: Zaine Elsass, MRN: [id number],    Date: 07/27/2019  Time Spent: 50 minutes   Treatment Type: Individual Therapy  Reported Symptoms: anxious, sad  Mental Status Exam:  Appearance:   Casual     Behavior:  Appropriate  Motor:  Normal  Speech/Language:   Clear and Coherent  Affect:  Appropriate  Mood:  anxious and sad  Thought process:  normal  Thought content:    WNL  Sensory/Perceptual disturbances:    WNL  Orientation:  oriented to person, place, time/date and situation  Attention:  Good  Concentration:  Good  Memory:  WNL  Fund of knowledge:   Good  Insight:    Good  Judgment:   Good  Impulse Control:  Good   Risk Assessment: Danger to Self:  No Self-injurious Behavior: No Danger to Others: No Duty to Warn:no Physical Aggression / Violence:No  Access to Firearms a concern: No  Gang Involvement:No   Subjective: The client states that he has 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  This has been a long time desire for the client.  He has been able to set it up in his garage.  In doing some of the roastings he needed to set aside some time in the evening.  He discussed it with his wife and was able to assertively work out a time to do so.  This is a step forward for the client.  Previously he would have been more anxious and caved to what his wife wanted.  He also was able to assertively set a boundary for work.  He was scheduled to be off and someone else called him to see if he could come in.  In the past he would have done that but he decided that he could not do so.  The client is very encouraged by this development Client recently found out that his sister and brother-in-law have separated.  This was a surprise for the client.  He has found that his whole family is now coming to him as the point person.  He is surprised at this turn of events.  He is sad at his sister's separation.  He has been able to be  supportive for both her and his brother-in-law.  This event has highlighted more of the fractures in their family.  The client was surprised by a call from his older sister that he said, "I had to talk her off the ledge."  The client is walking all of this out carefully setting boundaries when necessary.  He will continue with assertiveness and positive self talk as well.   Interventions: Assertiveness/Communication, Motivational Interviewing, Solution-Oriented/Positive Psychology and Insight-Oriented  Diagnosis:   ICD-10-CM   1. Adjustment disorder with mixed anxiety and depressed mood  F43.23     Plan: Self-care, assertiveness, boundaries, positive self talk.  Chanya Chrisley, [facility]

## 2019-08-09 ENCOUNTER — Ambulatory Visit (INDEPENDENT_AMBULATORY_CARE_PROVIDER_SITE_OTHER): Payer: 59 | Admitting: Psychiatry

## 2019-08-09 ENCOUNTER — Other Ambulatory Visit: Payer: Self-pay

## 2019-08-09 ENCOUNTER — Encounter: Payer: Self-pay | Admitting: Psychiatry

## 2019-08-09 DIAGNOSIS — F4323 Adjustment disorder with mixed anxiety and depressed mood: Secondary | ICD-10-CM

## 2019-08-09 NOTE — Progress Notes (Signed)
      [facility] Counselor/Therapist Progress Note  Patient ID: Alan Bauer, MRN: [id number],    Date: 08/09/2019  Time Spent: 50 minutes   Treatment Type: Individual Therapy  Reported Symptoms: anxious, sad  Mental Status Exam:  Appearance:   Casual     Behavior:  Appropriate  Motor:  Normal  Speech/Language:   Clear and Coherent  Affect:  Appropriate  Mood:  anxious and sad  Thought process:  normal  Thought content:    WNL  Sensory/Perceptual disturbances:    WNL  Orientation:  oriented to person, place, time/date and situation  Attention:  Good  Concentration:  Good  Memory:  WNL  Fund of knowledge:   Good  Insight:    Good  Judgment:   Good  Impulse Control:  Good   Risk Assessment: Danger to Self:  No Self-injurious Behavior: No Danger to Others: No Duty to Warn:no Physical Aggression / Violence:No  Access to Firearms a concern: No  Gang Involvement:No   Subjective: The client is still roasting coffee which she has found a lot of satisfaction in.  He describes having a rough weekend.  His wife had gone out of town to help a friend do a wedding event.  He had all 3 children and attempted to finish remodeling their bathroom.  Alan Bauer was his wife's birthday.  Altogether, he felt like he did not do enough to stay in contact with his wife or celebrate her for her birthday.  "I am in a funk today." I used eye-movement with the client focusing on his flat mood and his negative thought, "I am not doing enough."  As the client processed he noted that he has a negative internal dialogue where he is very hard on himself.  He has not use to caring for himself.  It seems that everybody else's needs come first.  We discussed what the client needs to do and caring for his own needs.  Usually that involves doing some kind of activity that is creative or movement oriented.  We discussed that he and his wife would benefit from having a conversation about setting time aside in their  schedules for each person's needs.  The client thought this would be a good idea.  The client subjective units of distress started at an 8 but was only able to reduce to a 6.  He will have a conversation with his wife.  Interventions: Assertiveness/Communication, Motivational Interviewing, Solution-Oriented/Positive Psychology, [employer] Desensitization and Reprocessing (EMDR) and Insight-Oriented  Diagnosis:   ICD-10-CM   1. Adjustment disorder with mixed anxiety and depressed mood  F43.23     Plan: Positive self talk, self-care, conversation with wife, boundaries, assertiveness.  Sedra Morfin, [facility]

## 2019-08-22 ENCOUNTER — Ambulatory Visit (INDEPENDENT_AMBULATORY_CARE_PROVIDER_SITE_OTHER): Payer: 59 | Admitting: Psychiatry

## 2019-08-22 ENCOUNTER — Other Ambulatory Visit: Payer: Self-pay

## 2019-08-22 DIAGNOSIS — F4323 Adjustment disorder with mixed anxiety and depressed mood: Secondary | ICD-10-CM | POA: Diagnosis not present

## 2019-08-23 ENCOUNTER — Encounter: Payer: Self-pay | Admitting: Psychiatry

## 2019-08-23 NOTE — Progress Notes (Signed)
      [facility] Counselor/Therapist Progress Note  Patient ID: Alan Bauer, MRN: [id number],    Date: 08/22/2019  Time Spent: 50 minutes   Treatment Type: Individual Therapy  Reported Symptoms: anxiety, sad  Mental Status Exam:  Appearance:   Casual     Behavior:  Appropriate  Motor:  Normal  Speech/Language:   Clear and Coherent  Affect:  Appropriate  Mood:  anxious and sad  Thought process:  normal  Thought content:    WNL  Sensory/Perceptual disturbances:    WNL  Orientation:  oriented to person, place, time/date and situation  Attention:  Good  Concentration:  Good  Memory:  WNL  Fund of knowledge:   Good  Insight:    Good  Judgment:   Good  Impulse Control:  Good   Risk Assessment: Danger to Self:  No Self-injurious Behavior: No Danger to Others: No Duty to Warn:no Physical Aggression / Violence:No  Access to Firearms a concern: No  Gang Involvement:No   Subjective: The client states that his wife went out of town for 4 days.  He was in charge of the children during that time which he states went fairly well.  We discussed what the client's self talk was like.  He stated he had met with a friend to play disc golf.  He discussed with him his negative thoughts and his friend directed him on how to restructure those.  The client states he is now much more aware.  He felt what he learned here and what his friend said was exceptionally helpful.  He states he is much more mindful of his negative thoughts and looks to neutralize them. The client has also increased his self-care.  He has very intentionally increased behaviors in his life that he finds replenishing or refreshing.  These include listening to audiobooks, disc golf with his friends, roasting coffee, and time with his wife.  He agreed that he needs to increase his exercise.  He likes interval training and will look to see how he can integrate that schedule wise into his life.  He will also increase the amount of  stretching he does.  The client agrees that he will try to do this every other day. The client states that his sister and her husband have reunited.  He states his sister clearly blames her husband for their problems.  The client  sees that it is not so.  He continues to have conversations with his extended family.  He finds he is less tied in to trying to please them and more focused on setting boundaries.  He and his wife have already made some decisions about the holidays that will be positive for his immediate family.   Interventions: Assertiveness/Communication, Solution-Oriented/Positive Psychology and Insight-Oriented  Diagnosis:   ICD-10-CM   1. Adjustment disorder with mixed anxiety and depressed mood  F43.23     Plan: Boundaries, assertiveness, intentional self-care, positive self talk, mindfulness.  Philip Kotlyar, [facility]

## 2019-09-07 ENCOUNTER — Other Ambulatory Visit: Payer: Self-pay

## 2019-09-07 ENCOUNTER — Encounter: Payer: Self-pay | Admitting: Psychiatry

## 2019-09-07 ENCOUNTER — Ambulatory Visit (INDEPENDENT_AMBULATORY_CARE_PROVIDER_SITE_OTHER): Payer: 59 | Admitting: Psychiatry

## 2019-09-07 DIAGNOSIS — F4323 Adjustment disorder with mixed anxiety and depressed mood: Secondary | ICD-10-CM
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
## 2019-09-19 ENCOUNTER — Ambulatory Visit (INDEPENDENT_AMBULATORY_CARE_PROVIDER_SITE_OTHER): Payer: 59 | Admitting: Psychiatry

## 2019-09-19 ENCOUNTER — Encounter: Payer: Self-pay | Admitting: Psychiatry

## 2019-09-19 ENCOUNTER — Other Ambulatory Visit: Payer: Self-pay

## 2019-09-19 DIAGNOSIS — F4323 Adjustment disorder with mixed anxiety and depressed mood: Secondary | ICD-10-CM | POA: Diagnosis not present
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
## 2019-10-04 ENCOUNTER — Other Ambulatory Visit: Payer: Self-pay

## 2019-10-04 ENCOUNTER — Ambulatory Visit (INDEPENDENT_AMBULATORY_CARE_PROVIDER_SITE_OTHER): Payer: 59 | Admitting: Psychiatry

## 2019-10-04 ENCOUNTER — Encounter: Payer: Self-pay | Admitting: Psychiatry

## 2019-10-04 DIAGNOSIS — F4323 Adjustment disorder with mixed anxiety and depressed mood: Secondary | ICD-10-CM | POA: Diagnosis not present

## 2019-10-04 NOTE — Progress Notes (Signed)
      [facility] Counselor/Therapist Progress Note  Patient ID: Alan Bauer, MRN: [id number],    Date: 10/04/2019  Time Spent: 50 minutes   Treatment Type: Individual Therapy  Reported Symptoms: anxiety, sad  Mental Status Exam:  Appearance:   Bizarre     Behavior:  Appropriate  Motor:  Normal  Speech/Language:   Clear and Coherent  Affect:  Appropriate  Mood:  anxious and sad  Thought process:  normal  Thought content:    WNL  Sensory/Perceptual disturbances:    WNL  Orientation:  oriented to person, place, time/date and situation  Attention:  Good  Concentration:  Good  Memory:  WNL  Fund of knowledge:   Good  Insight:    Good  Judgment:   Good  Impulse Control:  Good   Risk Assessment: Danger to Self:  No Self-injurious Behavior: No Danger to Others: no Duty to Warn:no Physical Aggression / Violence:No  Access to Firearms a concern: No  Gang Involvement:No   Subjective: The client in conjunction with his sister has set some significant boundaries with his younger brother.  This brother lives in [place]  Due to the increase in the COVID-19 pandemic numbers they both have asked him not to come.  It would be dangerous to expose their parents as well as them.  Their brother actually agreed.  This was a change of behavior for the client from being passive to being active. The client states that he has gotten better with his self-care.  "I cut out social media.  It causes me to spend too much money.  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 Today the client wanted to talk about his relationship with his wife.  He sees that she is overloaded with her mom, her sister, and her circle of friends.  The client states that he sees his wife taking on more responsibility for other people's issues than she can.  He knows that she is a highly sensitive  person and as a result needs time for reflection and self-care.  We discussed him being more assertive and setting some boundaries with his wife so that she can get the self-care she needs I pointed out to the client that his wife seems to be controlled by the "tyrrany of the urgent".  I suggested that they together discuss what would be proper boundaries for her.  I encouraged him to work towards helping those get enforced.  He agreed and thought that that would be very helpful.  Interventions: Assertiveness/Communication, Motivational Interviewing, Solution-Oriented/Positive Psychology and Insight-Oriented  Diagnosis:   ICD-10-CM   1. Adjustment disorder with mixed anxiety and depressed mood  F43.23     Plan: Boundaries, assertiveness, communication with wife, exercise, self-care.  Debhora Titus, [facility]

## 2019-10-17 ENCOUNTER — Ambulatory Visit: Payer: 59 | Admitting: Psychiatry

## 2019-10-26 ENCOUNTER — Ambulatory Visit (INDEPENDENT_AMBULATORY_CARE_PROVIDER_SITE_OTHER): Payer: 59 | Admitting: Psychiatry

## 2019-10-26 ENCOUNTER — Other Ambulatory Visit: Payer: Self-pay

## 2019-10-26 ENCOUNTER — Encounter: Payer: Self-pay | Admitting: Psychiatry

## 2019-10-26 DIAGNOSIS — F4323 Adjustment disorder with mixed anxiety and depressed mood: Secondary | ICD-10-CM

## 2019-10-26 NOTE — Progress Notes (Signed)
[facility] Counselor/Therapist Progress Note  Patient ID: Alan Bauer, MRN: [id number],    Date: 10/26/2019  Time Spent: 50 minutes   Treatment Type: Individual Therapy  Reported Symptoms: anxious, sad.  Mental Status Exam:  Appearance:   Casual     Behavior:  Appropriate  Motor:  Normal  Speech/Language:   Clear and Coherent  Affect:  Appropriate  Mood:  anxious and sad  Thought process:  normal  Thought content:    WNL  Sensory/Perceptual disturbances:    WNL  Orientation:  oriented to person, place, time/date and situation  Attention:  Good  Concentration:  Good  Memory:  WNL  Fund of knowledge:   Good  Insight:    Good  Judgment:   Good  Impulse Control:  Good   Risk Assessment: Danger to Self:  No Self-injurious Behavior: No Danger to Others: No Duty to Warn:no Physical Aggression / Violence:No  Access to Firearms a concern: No  Gang Involvement:No   Subjective: The client states that Christmas was good for his family.  "My family of origin is still weird."  The client describes himself as being in a "funk "with his family.  He notices the weight of growing indifference when he engages with them.  "I would rather just be mad."  Today we started with eye-movement focusing on the client's father.  His negative cognition is, "I am frustrated.  He feels that frustration in his chest.  His subjective units of distress is a 5. As the client processed he stated, "they just have a lack of interest in [place] (my own family).  This makes the client sad.  He said it really came home to him with his boss.  Before Christmas the client had roasted over 50 bags of coffee his boss had ordered as Christmas gifts.  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  The client's mother who works directly with the boss said, "like he has time for that."  This cut the client to the  quick.  He states that his parents really do not perceive his value and see him in this one-dimensional way.  As we continued to process we discussed that the client would have to stake out his boundaries as an adult with his parents.  He Alan Bauer be their child but he is their adult child.  The client made the comment that, "my parents are not even people."  What he means is that they live in a very narrow box and never think outside of that.  We discussed that the client is now passed the major holiday.  He does not have to interact with his family very much now.  I encouraged him to allow himself to process his feelings about his relationship with them.  I encouraged him to remember that he is an adult and he gets to call the shots about what he and his own family will or will not do.  The client's positive cognition at the end of the session was, "on to the good stuff."  His subjective units of distress was less than 2.  Interventions: Assertiveness/Communication, Motivational Interviewing, Solution-Oriented/Positive Psychology, [employer] Desensitization and Reprocessing (EMDR) and Insight-Oriented  Diagnosis:   ICD-10-CM   1. Adjustment disorder with mixed anxiety and depressed mood  F43.23     Plan: Boundaries, assertiveness, radical acceptance,  positive self talk, exercise, creative activities.  Alan Bauer, [facility]

## 2019-11-07 ENCOUNTER — Other Ambulatory Visit: Payer: Self-pay

## 2019-11-07 ENCOUNTER — Ambulatory Visit (INDEPENDENT_AMBULATORY_CARE_PROVIDER_SITE_OTHER): Payer: 59 | Admitting: Psychiatry

## 2019-11-07 ENCOUNTER — Encounter: Payer: Self-pay | Admitting: Psychiatry

## 2019-11-07 DIAGNOSIS — F4323 Adjustment disorder with mixed anxiety and depressed mood: Secondary | ICD-10-CM

## 2019-11-07 NOTE — Progress Notes (Signed)
      [facility] Counselor/Therapist Progress Note  Patient ID: Alan Bauer, MRN: [id number],    Date: 11/07/2019  Time Spent: 50 minutes   Treatment Type: Individual Therapy  Reported Symptoms: anxious, sad  Mental Status Exam:  Appearance:   Casual     Behavior:  Appropriate  Motor:  Normal  Speech/Language:   Clear and Coherent  Affect:  Appropriate  Mood:  anxious and sad  Thought process:  normal  Thought content:    WNL  Sensory/Perceptual disturbances:    WNL  Orientation:  oriented to person, place, time/date and situation  Attention:  Good  Concentration:  Good  Memory:  WNL  Fund of knowledge:   Good  Insight:    Good  Judgment:   Good  Impulse Control:  Good   Risk Assessment: Danger to Self:  No Self-injurious Behavior: No Danger to Others: No Duty to Warn:no Physical Aggression / Violence:No  Access to Firearms a concern: No  Gang Involvement:No   Subjective: The client states this last week has been busy.  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  He is grateful that his wife is so creative and a Manufacturing engineer to boot.  This past week, someone had gifted him a session in the sensory deprivation tank.  He stated it was a very nice experience and relaxing. 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  His negative cognition is, "am I good enough?"  He feels uncertainty in his chest.  His subjective units of distress is a 5+.  As the client processed he noted that his parents finally texted him this past weekend.  He said, "I was wondering how long it would take them to contact me if I did not initiate it first."  He feels some grief and sadness connected to his relationship with his parents.  It brought home to him that his parents are not really interested in his life but only wants the  most superficial relationship.  As we continue to process the issues around the coffee company.  His uncertainty came out of his ability to have the technical nuance to roast the coffee to the standards he wants.  As he continued he stated, "I can do this."  This was his positive cognition at the end of the session.  His subjective units of distress was less than 2.  Interventions: Assertiveness/Communication, Motivational Interviewing, Solution-Oriented/Positive Psychology, [employer] Desensitization and Reprocessing (EMDR) and Insight-Oriented  Diagnosis:   ICD-10-CM   1. Adjustment disorder with mixed anxiety and depressed mood  F43.23     Plan: Positive self talk, self-care, assertiveness, boundaries, exercise.  Alan Bauer, [facility]

## 2019-11-21 ENCOUNTER — Encounter: Payer: Self-pay | Admitting: Psychiatry

## 2019-11-21 ENCOUNTER — Other Ambulatory Visit: Payer: Self-pay

## 2019-11-21 ENCOUNTER — Ambulatory Visit (INDEPENDENT_AMBULATORY_CARE_PROVIDER_SITE_OTHER): Payer: 59 | Admitting: Psychiatry

## 2019-11-21 DIAGNOSIS — F4323 Adjustment disorder with mixed anxiety and depressed mood: Secondary | ICD-10-CM | POA: Diagnosis not present

## 2019-11-21 NOTE — Progress Notes (Signed)
      [facility] Counselor/Therapist Progress Note  Patient ID: Alan Bauer, MRN: [id number],    Date: 11/21/2019  Time Spent: 50 minutes   Treatment Type: Individual Therapy  Reported Symptoms: sad, anxious  Mental Status Exam:  Appearance:   Casual     Behavior:  Appropriate  Motor:  Normal  Speech/Language:   Clear and Coherent  Affect:  Appropriate  Mood:  anxious and sad  Thought process:  normal  Thought content:    WNL  Sensory/Perceptual disturbances:    WNL  Orientation:  oriented to person, place, time/date and situation  Attention:  Good  Concentration:  Good  Memory:  WNL  Fund of knowledge:   Good  Insight:    Good  Judgment:   Good  Impulse Control:  Good   Risk Assessment: Danger to Self:  No Self-injurious Behavior: No Danger to Others: No Duty to Warn:no Physical Aggression / Violence:No  Access to Firearms a concern: No  Gang Involvement:No   Subjective: The client states that life seems to be opening up more for him in terms of his creativity.  He is actively trying to make space in his life for the things that refresh and replenish him.  He states today that he feels some disconnect with his wife which causes a little sadness. This causes a bit of anxiety considering the difficult family dynamics so far.  He states that he has been criticized by his wife for not having enough empathy for his mother.  He states it has been hard to do so since they do not have much of a relationship.  He does not feel angry with her but notices more in difference.  We discussed what boundaries would look like going forward.  His father tends to want to call the family together to deal with things. He has his brother and sister-in-law coming in from the [place] in a few weeks.  Since his brother and sister-in-law are having marital issues he expects that Alan Bauer happen.  He does not feel terribly responsive though. We discussed being more intentional going forward and  having a plan on how he wants to respond to his family.  He definitely does not want to be the problem solver but will be attentive and helpful.  We discussed having more mood independent behavior making sure he gets his self-care done.  If not he will not be available for others.  He agrees to practice more mindfulness about these things.  Interventions: Assertiveness/Communication, Mindfulness Meditation, Motivational Interviewing, Solution-Oriented/Positive Psychology and Insight-Oriented  Diagnosis:   ICD-10-CM   1. Adjustment disorder with mixed anxiety and depressed mood  F43.23     Plan: Mindfulness, self-care, assertiveness, boundaries, intentionality, mood independent behavior.  Alan Bauer Alan Bauer, [facility]

## 2019-12-05 ENCOUNTER — Other Ambulatory Visit: Payer: Self-pay

## 2019-12-05 ENCOUNTER — Encounter: Payer: Self-pay | Admitting: Psychiatry

## 2019-12-05 ENCOUNTER — Ambulatory Visit (INDEPENDENT_AMBULATORY_CARE_PROVIDER_SITE_OTHER): Payer: 59 | Admitting: Psychiatry

## 2019-12-05 DIAGNOSIS — F4323 Adjustment disorder with mixed anxiety and depressed mood: Secondary | ICD-10-CM | POA: Diagnosis not present

## 2019-12-05 NOTE — Progress Notes (Signed)
      [facility] Counselor/Therapist Progress Note  Patient ID: Alan Bauer, MRN: [id number],    Date: 12/05/2019  Time Spent: 50 minutes   Treatment Type: Individual Therapy  Reported Symptoms: anxious, sad  Mental Status Exam:  Appearance:   Casual     Behavior:  Appropriate  Motor:  Normal  Speech/Language:   Clear and Coherent  Affect:  Appropriate  Mood:  anxious and sad  Thought process:  normal  Thought content:    WNL  Sensory/Perceptual disturbances:    WNL  Orientation:  oriented to person, place, time/date and situation  Attention:  Good  Concentration:  Good  Memory:  WNL  Fund of knowledge:   Good  Insight:    Good  Judgment:   Good  Impulse Control:  Good   Risk Assessment: Danger to Self:  No Self-injurious Behavior: No Danger to Others: No Duty to Warn:no Physical Aggression / Violence:No  Access to Firearms a concern: No  Gang Involvement:No   Subjective: The client comes in today with a pinched nerve in his neck that is causing shoulder pain.  He also has been experiencing some stress dreams which communicate a sense of powerlessness.  He will follow-up with massage therapist later today to hopefully resolve his neck issue.  It will also help to deal with some of his stress. The client's brother and wife have flown in from the [place].  They are quarantine at a house in [place], [place] versus staying with the client's parents.  The client states that there is been conflict between his mom and dad about whether or not his brother and sister-in-law should stay with them.  "I am staying out of it." The client states that he has been listening to some podcasts about Christianity as well as talking to a pastor friend of his.  He discussed how growing up in the family system that he did, the way he was taught to live the [place] life did not seem to be authentic to him.  He is seeking that through his reading and conversations with his wife  and friends.  Today we discussed the client's angst as he tries to navigate this difficult path.  "I have no desire to pray or to read the Bible."  I encouraged the client to encounter God in ways that make sense to him.  This might help him develop more of a free range Christianity where he can meet God more authentically.  The client agreed.  I used eye-movement with the client to try to reduce his overall stress.  Due to the severe pain he was having in his shoulder he was not able to reduce his stress.  Interventions: Assertiveness/Communication, Motivational Interviewing, Solution-Oriented/Positive Psychology, [employer] Desensitization and Reprocessing (EMDR) and Insight-Oriented  Diagnosis:   ICD-10-CM   1. Adjustment disorder with mixed anxiety and depressed mood  F43.23     Plan: Podcasts and reading, positive self talk, assertiveness, boundaries, self-care, exercise.  Follow-up with massage therapist today.  Alan Bauer, [facility]

## 2019-12-19 ENCOUNTER — Other Ambulatory Visit: Payer: Self-pay

## 2019-12-19 ENCOUNTER — Encounter: Payer: Self-pay | Admitting: Psychiatry

## 2019-12-19 ENCOUNTER — Ambulatory Visit (INDEPENDENT_AMBULATORY_CARE_PROVIDER_SITE_OTHER): Payer: 59 | Admitting: Psychiatry

## 2019-12-19 DIAGNOSIS — F4323 Adjustment disorder with mixed anxiety and depressed mood: Secondary | ICD-10-CM
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
## 2020-01-02 ENCOUNTER — Encounter: Payer: Self-pay | Admitting: Psychiatry

## 2020-01-02 ENCOUNTER — Ambulatory Visit (INDEPENDENT_AMBULATORY_CARE_PROVIDER_SITE_OTHER): Payer: 59 | Admitting: Psychiatry

## 2020-01-02 ENCOUNTER — Other Ambulatory Visit: Payer: Self-pay

## 2020-01-02 DIAGNOSIS — F4323 Adjustment disorder with mixed anxiety and depressed mood: Secondary | ICD-10-CM | POA: Diagnosis not present

## 2020-01-02 NOTE — Progress Notes (Signed)
      [facility] Counselor/Therapist Progress Note  Patient ID: Jerral Mccauley, MRN: [id number],    Date: 01/02/2020  Time Spent: 50 minutes   Treatment Type: Individual Therapy  Reported Symptoms: sad, anxious  Mental Status Exam:  Appearance:   Casual     Behavior:  Appropriate  Motor:  Normal  Speech/Language:   Clear and Coherent  Affect:  Appropriate  Mood:  anxious and sad  Thought process:  normal  Thought content:    WNL  Sensory/Perceptual disturbances:    WNL  Orientation:  oriented to person, place, time/date and situation  Attention:  Good  Concentration:  Good  Memory:  WNL  Fund of knowledge:   Good  Insight:    Good  Judgment:   Good  Impulse Control:  Good   Risk Assessment: Danger to Self:  No Self-injurious Behavior: No Danger to Others: No Duty to Warn:no Physical Aggression / Violence:No  Access to Firearms a concern: No  Gang Involvement:No   Subjective: The client states he feels more settled with the issues concerning his brother.  He has not had a chance to sit down with him yet but hopes to do so soon. Today we discussed some of the client's spiritual crisis that he is having.  He is deconstructing his faith from the way he was raised.  His wife has shared some of her experiences spiritually which has been positive for him.  She seems to have a depth of relationship that he does not have any experience with. The client states he will listen to the book, "the unseen realm" as a way to start his process.  I also encouraged the client to read scripture and pursue his own relationship with God.  It is fair to question.  It is fair to pray with the expectation of a response.  The client will focus on doing this.  Interventions: Motivational Interviewing, Solution-Oriented/Positive Psychology and Insight-Oriented  Diagnosis:   ICD-10-CM   1. Adjustment disorder with mixed anxiety and depressed mood  F43.23     Plan: Mindful prayer, scripture, talk to  brother, boundaries, assertiveness, exercise.  Gelene Mink Shasta Chinn, [facility]

## 2020-01-16 ENCOUNTER — Other Ambulatory Visit: Payer: Self-pay

## 2020-01-16 ENCOUNTER — Encounter: Payer: Self-pay | Admitting: Psychiatry

## 2020-01-16 ENCOUNTER — Ambulatory Visit (INDEPENDENT_AMBULATORY_CARE_PROVIDER_SITE_OTHER): Payer: 59 | Admitting: Psychiatry

## 2020-01-16 DIAGNOSIS — F4323 Adjustment disorder with mixed anxiety and depressed mood: Secondary | ICD-10-CM

## 2020-01-16 NOTE — Progress Notes (Signed)
      [facility] Counselor/Therapist Progress Note  Patient ID: Alan Bauer, MRN: [id number],    Date: 01/16/2020  Time Spent: 50 minutes   Treatment Type: Individual Therapy  Reported Symptoms: anxious, irritable  Mental Status Exam:  Appearance:   Casual     Behavior:  Appropriate  Motor:  Normal  Speech/Language:   Clear and Coherent  Affect:  Appropriate  Mood:  anxious and irritable  Thought process:  normal  Thought content:    WNL  Sensory/Perceptual disturbances:    WNL  Orientation:  oriented to person, place, time/date and situation  Attention:  Good  Concentration:  Good  Memory:  WNL  Fund of knowledge:   Good  Insight:    Good  Judgment:   Good  Impulse Control:  Good   Risk Assessment: Danger to Self:  No Self-injurious Behavior: No Danger to Others: No Duty to Warn:no Physical Aggression / Violence:No  Access to Firearms a concern: No  Gang Involvement:No   Subjective: The client has been talking through his spiritual issues with his wife and a friend who is a Education officer, environmental.  He states that being around his family of origin has given him more insight at how difficult the family dynamic is.  His dad is still awkward and mostly focused on taking care of himself The client states today that he has found himself more annoyed with his children.  He does not want to recreate what his father did.  They had discontinued the pacifier for their middle son which has been difficult.  The client states his son has been acting out all week.  With all this he knows that he needs balance between time for himself and time for his family. Today we used eye-movement focusing on his children.  His negative cognition is, "I do not like to be needed."  He feels frustration in his chest.  His subjective units of distress is around a 3.  As the client processed he sees that when he was growing up his dad was either gone or angry.  He never had any kind of role model on what it was like to  be a dad.  He realizes that his own anger is mostly circumstantial.  He has focused on trying to be more intentional with his children.  I asked the client to think about how his childhood was and what he might have needed.  Use that to see if he can give that to his own children.  The client agreed. The client will continue to focus on his balance between his needs and his children's needs.  He will also try to find a time for him and his wife to get away for a weekend..  Interventions: Assertiveness/Communication, Mindfulness Meditation, Motivational Interviewing, Solution-Oriented/Positive Psychology, [employer] Desensitization and Reprocessing (EMDR) and Insight-Oriented  Diagnosis:   ICD-10-CM   1. Adjustment disorder with mixed anxiety and depressed mood  F43.23     Plan: Intentionality, mood independent behavior, self-care, exercise, positive self talk, assertiveness, boundaries.  Alan Bauer, [facility]

## 2020-01-30 ENCOUNTER — Encounter: Payer: Self-pay | Admitting: Psychiatry

## 2020-01-30 ENCOUNTER — Other Ambulatory Visit: Payer: Self-pay

## 2020-01-30 ENCOUNTER — Ambulatory Visit (INDEPENDENT_AMBULATORY_CARE_PROVIDER_SITE_OTHER): Payer: 59 | Admitting: Psychiatry

## 2020-01-30 DIAGNOSIS — F4323 Adjustment disorder with mixed anxiety and depressed mood: Secondary | ICD-10-CM | POA: Diagnosis not present

## 2020-01-30 NOTE — Progress Notes (Signed)
      [facility] Counselor/Therapist Progress Note  Patient ID: Alan Bauer, MRN: [id number],    Date: 01/30/2020  Time Spent: 50 minutes   Treatment Type: Individual Therapy  Reported Symptoms: anxiety, sadness  Mental Status Exam:  Appearance:   Casual     Behavior:  Appropriate  Motor:  Normal  Speech/Language:   Clear and Coherent  Affect:  Appropriate  Mood:  anxious and sad  Thought process:  normal  Thought content:    WNL  Sensory/Perceptual disturbances:    WNL  Orientation:  oriented to person, place, time/date and situation  Attention:  Good  Concentration:  Good  Memory:  WNL  Fund of knowledge:   Good  Insight:    Good  Judgment:   Good  Impulse Control:  Good   Risk Assessment: Danger to Self:  No Self-injurious Behavior: No Danger to Others: No Duty to Warn:no Physical Aggression / Violence:No  Access to Firearms a concern: No  Gang Involvement:No   Subjective: The client states that things have gotten worse with his family.  "My brother is refusing to sign his wife's green card papers so she would have no option but to go back to the [place]."  The client is concerned that his brother is acting in ways that is emotionally harmful to his family.  He has talked to his mother who just wants to retreat from the situation.  His father is unsure what to do.  I used eye-movement with the client focusing on his brother.  His negative cognition is, "I am uncertain."  He feels anxiety in his chest. His subjective units of distress is a 7+.  As the client processed he was unsure if there should be a family intervention or what the best way to approach it would be.  He is concerned that the childhood trauma that he experienced with his brother would come up in that group setting.  We discussed the client having a one-on-one with his brother first.  I suggested that if the client approaches brother humbly, authentically that he would probably respond fairly well.   The client agreed.  I also suggested that his father who is familiar with the staff in the [place] could call them and state his brother needed a respite from [employer].  The client was unsure if his father would do this.  As we continued to process this the client became very sad and tearful.  He felt if he really let go he would be unable to stop.  The client did note that his anxiety was down to less than 3.  The client will consider an intervention with his brother.  He realizes that this is a changing point for his whole family of origin.  Interventions: Assertiveness/Communication, Mindfulness Meditation, Motivational Interviewing, Solution-Oriented/Positive Psychology, [employer] Desensitization and Reprocessing (EMDR) and Insight-Oriented  Diagnosis:   ICD-10-CM   1. Adjustment disorder with mixed anxiety and depressed mood  F43.23     Plan: Mindfulness, assertiveness, boundaries, family intervention with brother, one-on-one with brother, positive self talk, self-care, exercise.  Alan Bauer, [facility]

## 2020-02-13 ENCOUNTER — Other Ambulatory Visit: Payer: Self-pay

## 2020-02-13 ENCOUNTER — Encounter: Payer: Self-pay | Admitting: Psychiatry

## 2020-02-13 ENCOUNTER — Ambulatory Visit (INDEPENDENT_AMBULATORY_CARE_PROVIDER_SITE_OTHER): Payer: 59 | Admitting: Psychiatry

## 2020-02-13 DIAGNOSIS — F4323 Adjustment disorder with mixed anxiety and depressed mood: Secondary | ICD-10-CM | POA: Diagnosis not present

## 2020-02-13 NOTE — Progress Notes (Signed)
      [facility] Counselor/Therapist Progress Note  Patient ID: Michaelpaul Apo, MRN: [id number],    Date: 02/13/2020  Time Spent: 50 minutes   Treatment Type: Individual Therapy  Reported Symptoms: anxious, sad  Mental Status Exam:  Appearance:   Casual     Behavior:  Appropriate  Motor:  Normal  Speech/Language:   Clear and Coherent  Affect:  Appropriate  Mood:  anxious and sad  Thought process:  normal  Thought content:    WNL  Sensory/Perceptual disturbances:    WNL  Orientation:  oriented to person, place, time/date and situation  Attention:  Good  Concentration:  Good  Memory:  WNL  Fund of knowledge:   Good  Insight:    Good  Judgment:   Good  Impulse Control:  Good   Risk Assessment: Danger to Self:  No Self-injurious Behavior: No Danger to Others: No Duty to Warn:no Physical Aggression / Violence:No  Access to Firearms a concern: No  Gang Involvement:No    Subjective: The client states that he was able to talk with his brother and get a productive conversation going.  He did not bring up their childhood event because he felt like it was not the right time.  "I was able to ask my brother questions and give him space".  His brother seem to respond well to that and the client was encouraged.  The client continues to have low-level anxiety and sadness.  We discussed today where the client thought that that might be coming from.  He relates most of it to his family of origin especially his relationship with his dad. The client also realizes that he might be putting too much pressure on himself.  He has expectations for himself that he should not fail.  I used eye-movement with the client focusing on this issue.  The negative cognition being, "I will fail."  He feels anxiety and stress in his chest.  His subjective units of distress is about a 4.  As the client processed he noticed more tension in his neck and his back.  We discussed how he tends to carry this around with him.   I discussed more mindful acceptance of who he is.  I suggested that he needs to settle into the fact that he is competent and capable and has his own wisdom.  He agreed.  His subjective units of distress was less than 3.  Interventions: Assertiveness/Communication, Mindfulness Meditation, Motivational Interviewing, Solution-Oriented/Positive Psychology, [employer] Desensitization and Reprocessing (EMDR) and Insight-Oriented  Diagnosis:   ICD-10-CM   1. Adjustment disorder with mixed anxiety and depressed mood  F43.23     Plan: Mindfulness, self-care, positive self talk, radical acceptance, assertiveness, boundaries.  Gelene Mink Jairo Bellew, [facility]

## 2020-02-27 ENCOUNTER — Encounter: Payer: Self-pay | Admitting: Psychiatry

## 2020-02-27 ENCOUNTER — Other Ambulatory Visit: Payer: Self-pay

## 2020-02-27 ENCOUNTER — Ambulatory Visit: Payer: 59 | Admitting: Psychiatry

## 2020-02-27 ENCOUNTER — Ambulatory Visit (INDEPENDENT_AMBULATORY_CARE_PROVIDER_SITE_OTHER): Payer: 59 | Admitting: Psychiatry

## 2020-02-27 DIAGNOSIS — F4323 Adjustment disorder with mixed anxiety and depressed mood: Secondary | ICD-10-CM

## 2020-02-27 NOTE — Progress Notes (Signed)
      [facility] Counselor/Therapist Progress Note  Patient ID: Alan Bauer, MRN: [id number],    Date: 02/27/2020  Time Spent: 50 minutes   Treatment Type: Individual Therapy  Reported Symptoms: anxiety, irritation  Mental Status Exam:  Appearance:   Casual     Behavior:  Appropriate  Motor:  Normal  Speech/Language:   Clear and Coherent  Affect:  Appropriate  Mood:  anxious and irritable  Thought process:  normal  Thought content:    WNL  Sensory/Perceptual disturbances:    WNL  Orientation:  oriented to person, place, time/date and situation  Attention:  Good  Concentration:  Good  Memory:  WNL  Fund of knowledge:   Good  Insight:    Good  Judgment:   Good  Impulse Control:  Good   Risk Assessment: Danger to Self:  No Self-injurious Behavior: No Danger to Others: No Duty to Warn:no Physical Aggression / Violence:No  Access to Firearms a concern: No  Gang Involvement:No   Subjective: The client comes in today very tired from his weekend.  He states that his wife has taken on another job as her Theatre stage manager.  The client is also back in his office on a daily basis.  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  "My work and home life has been out of balance."  Today we focused on the client's level of stress he feels with things being out of balance.  His subjective units of distress is a 6+.  He feels the stress in his shoulders.  His negative cognition is, "I am out of balance."  The client has started putting boundaries around his work.  As the client processed, he also realized that part of his frustration comes out of his family of origin.  His brother and sister-in-law are in from the [place] on furlough as missionaries.  The client states that their relationship is falling apart and nothing is being done.  He is frustrated that his parents have taken a hands-off approach.  His other family members have been less than  involved.  We discussed the client journaling out his thoughts on what is going on with his brother and strategies that need to be implemented.  I then suggested the client either send a letter to his family, have a conversation with them or meet together.  Client states he feels everything coming to a crescendo.  "My family feels hopeless."  As the client continue to process he knew that there was only so much he could do.  His subjective units of distress was less than 3 at the end of the session.  Interventions: Assertiveness/Communication, Motivational Interviewing, Solution-Oriented/Positive Psychology, [employer] Desensitization and Reprocessing (EMDR) and Insight-Oriented  Diagnosis:   ICD-10-CM   1. Adjustment disorder with mixed anxiety and depressed mood  F43.23     Plan: Journaling, positive self talk, self-care, exercise, boundaries, assertiveness.  Gelene Mink Boneta Standre, [facility]

## 2020-03-12 ENCOUNTER — Other Ambulatory Visit: Payer: Self-pay

## 2020-03-12 ENCOUNTER — Encounter: Payer: Self-pay | Admitting: Psychiatry

## 2020-03-12 ENCOUNTER — Ambulatory Visit (INDEPENDENT_AMBULATORY_CARE_PROVIDER_SITE_OTHER): Payer: 59 | Admitting: Psychiatry

## 2020-03-12 DIAGNOSIS — F4323 Adjustment disorder with mixed anxiety and depressed mood: Secondary | ICD-10-CM

## 2020-03-12 NOTE — Progress Notes (Signed)
      [facility] Counselor/Therapist Progress Note  Patient ID: Alan Bauer, MRN: [id number],    Date: 03/12/2020  Time Spent: 50 minutes   Treatment Type: Individual Therapy  Reported Symptoms: irritable, anxious  Mental Status Exam:  Appearance:   Casual     Behavior:  Appropriate  Motor:  Normal  Speech/Language:   Clear and Coherent  Affect:  Appropriate  Mood:  anxious and irritable  Thought process:  normal  Thought content:    WNL  Sensory/Perceptual disturbances:    WNL  Orientation:  oriented to person, place, time/date and situation  Attention:  Good  Concentration:  Good  Memory:  WNL  Fund of knowledge:   Good  Insight:    Good  Judgment:   Good  Impulse Control:  Good   Risk Assessment: Danger to Self:  No Self-injurious Behavior: No Danger to Others: No Duty to Warn:no Physical Aggression / Violence:No  Access to Firearms a concern: No  Gang Involvement:No   Subjective: The client states that he is back from a weekend away [place].  He feels better.  Today he states he has been feeling more insecure with his job.  He talked with his boss to make sure that he was checking all the correct boxes.  His boss did affirm him in a positive way.  The client is still uncertain.  He has been tapped to go to the [employer] in [place].  This is the first time he has gone to a conference like this.  He is hoping that it will improve his confidence with his job.  He finds himself getting frustrated with the sheer magnitude of everything that has to be done.   Today we discussed how to set the boundaries between his work and home life.  I used eye-movement with the client focusing on this.  He feels anxiety in his chest.  His subjective units of distress is a 4.  As we processed, the client knows that his weaknesses are with organization and scheduling.  When he gets busy his stress increases because there is so much  involved.  We discussed being able to set his phone aside when he gets home.  Also shifting his work email to a separate icon that is on a secondary screen so it is not immediately seen on his phone.  The client thought this was a great idea.  The client also brainstormed about other things he wants to do that will be self improvement related.  I suggested to the client that he think about casting his net wide to consider things that he Kerstie Agent not have considered before.  The client agreed.  His subjective units of distress at the end of the session was less than 2.  Interventions: Assertiveness/Communication, Motivational Interviewing, Solution-Oriented/Positive Psychology, [employer] Desensitization and Reprocessing (EMDR) and Insight-Oriented  Diagnosis:   ICD-10-CM   1. Adjustment disorder with mixed anxiety and depressed mood  F43.23     Plan: Mood independent behavior, self-care, exercise, positive self talk, separation between work and home, develop other activities to replenish and refresh.  Gelene Mink Makaiah Terwilliger, [facility]

## 2020-03-28 ENCOUNTER — Encounter: Payer: Self-pay | Admitting: Psychiatry

## 2020-03-28 ENCOUNTER — Other Ambulatory Visit: Payer: Self-pay

## 2020-03-28 ENCOUNTER — Ambulatory Visit (INDEPENDENT_AMBULATORY_CARE_PROVIDER_SITE_OTHER): Payer: 59 | Admitting: Psychiatry

## 2020-03-28 DIAGNOSIS — F4323 Adjustment disorder with mixed anxiety and depressed mood: Secondary | ICD-10-CM | POA: Diagnosis not present

## 2020-03-28 NOTE — Progress Notes (Signed)
[facility] Counselor/Therapist Progress Note  Patient ID: Alan Bauer, MRN: [id number],    Date: 03/28/2020  Time Spent: 50 minutes   Treatment Type: Individual Therapy  Reported Symptoms: sad, anxious  Mental Status Exam:  Appearance:   Casual     Behavior:  Appropriate  Motor:  Normal  Speech/Language:   Clear and Coherent  Affect:  Appropriate  Mood:  anxious and sad  Thought process:  normal  Thought content:    WNL  Sensory/Perceptual disturbances:    WNL  Orientation:  oriented to person, place, time/date and situation  Attention:  Good  Concentration:  Good  Memory:  WNL  Fund of knowledge:   Good  Insight:    Good  Judgment:   Good  Impulse Control:  Good   Risk Assessment: Danger to Self:  No Self-injurious Behavior: No Danger to Others: No Duty to Warn:no Physical Aggression / Violence:No  Access to Firearms a concern: No  Gang Involvement:No   Subjective: "I feel like I am crumbling."  The client describes himself as being too reactive to things.  After one of his work meetings yesterday he came away very anxious.  "I feel apathetic, out of gas, unhappy, reactive and irritable."  He stated he had a conversation with his younger brother who has begun to remember things from his childhood as well.  He told the client what his dad used to say to them when they were growing up.  His dad would tell his children that they were retarded because they could not load the dishwasher.  The client remembered this and it impacted him deeply.  He notes that his parents are currently housing a [place] family from [place].  His mom, who works in the same office that he does complains that they have stayed there too long.  The client is surprised that his parents were willing to put up the missionaries but not their own son and daughter-in-law coming in from the [place] at the same time.  The client felt that he was warehoused by his parents but not nurtured or  cared for.  Today I used eye-movement with the client focusing on his sadness and irritability.  His subjective units of distress was an 8+.  As the client processed he became very, very tearful.  The client admitted that he is holding back his emotion because if it comes out "it Shamari Lofquist never stop".  I assured the client that there would be an end to it but that he needed to allow himself to process the emotion.  What he was feeling was valid and true.  The message in his family of origin that expression of emotion was "drama".  I explained to the client that this was not drama but a genuine expression of grief and sorrow at love and care being withheld.  Over the weekend the client states he was at his parents house for the [facility] Day holiday.  He was playing with the soccer ball with his son in the backyard.  He stated his dad just sat on the deck and watched.  "He never did that with [place]."  He wants his dad to have some kind of understanding of his failure as a father.  As we continued to process the client's subjective units of distress came down to less than 5.  He was still quite agitated but had more insight into what was going on.  I encouraged the client to allow himself  some time to let him self feel what is really going on inside of him.  The client agreed.  Interventions: Assertiveness/Communication, Motivational Interviewing, Solution-Oriented/Positive Psychology, [employer] Desensitization and Reprocessing (EMDR) and Insight-Oriented  Diagnosis:   ICD-10-CM   1. Adjustment disorder with mixed anxiety and depressed mood  F43.23     Plan: Journal, time with himself, mood independent behavior, boundaries, self-care, exercise, assertiveness, positive self talk.  Gelene Mink Chun Sellen, [facility]

## 2020-04-11 ENCOUNTER — Ambulatory Visit (INDEPENDENT_AMBULATORY_CARE_PROVIDER_SITE_OTHER): Payer: 59 | Admitting: Psychiatry

## 2020-04-11 ENCOUNTER — Encounter: Payer: Self-pay | Admitting: Psychiatry

## 2020-04-11 ENCOUNTER — Other Ambulatory Visit: Payer: Self-pay

## 2020-04-11 DIAGNOSIS — F4323 Adjustment disorder with mixed anxiety and depressed mood: Secondary | ICD-10-CM

## 2020-04-11 NOTE — Progress Notes (Signed)
[facility] Counselor/Therapist Progress Note  Patient ID: Alan Bauer, MRN: [id number],    Date: 04/11/2020  Time Spent: 50 minutes   Treatment Type: Individual Therapy  Reported Symptoms: sad, anxious  Mental Status Exam:  Appearance:   Casual     Behavior:  Appropriate  Motor:  Normal  Speech/Language:   Clear and Coherent  Affect:  Appropriate  Mood:  anxious and sad  Thought process:  normal  Thought content:    WNL  Sensory/Perceptual disturbances:    WNL  Orientation:  oriented to person, place, time/date and situation  Attention:  Good  Concentration:  Good  Memory:  WNL  Fund of knowledge:   Good  Insight:    Good  Judgment:   Good  Impulse Control:  Good   Risk Assessment: Danger to Self:  No Self-injurious Behavior: No Danger to Others: No Duty to Warn:no Physical Aggression / Violence:No  Access to Firearms a concern: No  Gang Involvement:No   Subjective: "I am not doing that great today.  It is been a heavy 2 weeks."  The client states that he has an overall malaise.  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  He feels down and anxious.  His subjective units of distress is a 6+.  He feels it mainly in his head and shoulders.  Today I used eye-movement with the client focusing on the malaise that he feels.  As the client processed he admitted that he feels untethered, unmoored in his life.  He said that he has been trying to set simple goals from himself.  "I am trying not to isolate myself but to invite people in to where I am." As the client continued to process he felt a deep well of sadness within him.  A loss over the fact that his parents never really got him.  He recalled that recently he came home after work.  His son was upset and crying.  "I went in and sat down next to him.  I just tried to be with him, showing him love and explaining the right way to deal with his anger."  The client realizes  that he never got that nonjudgmental attitude from his parents.  In general he is frustrated with his family.  His brother has left again to watch basketball in [place], [place].  He leaves his wife and children behind with no care on how they are going to get around.  This is one example of the things that frustrate the client. The client is also focused on trying to figure out where he wants to go with his life.  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  It is also problematic that his mother works at the same church.  As we continue to process his subjective units of distress was less than 4.  I asked the client to give himself some time over this next week to feel his sadness and pain.  Also to think about which direction he might want to go in his life.  He agreed.  Interventions: Assertiveness/Communication, Motivational Interviewing, Solution-Oriented/Positive Psychology, [employer] Desensitization and Reprocessing (EMDR) and Insight-Oriented  Diagnosis:   ICD-10-CM   1. Adjustment disorder with mixed anxiety and depressed mood  F43.23     Plan: Journaling, mood independent behavior, positive self talk, self-care, boundaries, assertiveness, exercise, brainstormed about  future career.  Gelene Mink Aranza Geddes, [facility]

## 2020-04-25 ENCOUNTER — Ambulatory Visit: Payer: 59 | Admitting: Psychiatry

## 2020-04-27 ENCOUNTER — Other Ambulatory Visit: Payer: Self-pay

## 2020-04-27 ENCOUNTER — Encounter: Payer: Self-pay | Admitting: Psychiatry

## 2020-04-27 ENCOUNTER — Ambulatory Visit (INDEPENDENT_AMBULATORY_CARE_PROVIDER_SITE_OTHER): Payer: 59 | Admitting: Psychiatry

## 2020-04-27 DIAGNOSIS — F4323 Adjustment disorder with mixed anxiety and depressed mood: Secondary | ICD-10-CM | POA: Diagnosis not present
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
## 2020-05-02 ENCOUNTER — Encounter: Payer: Self-pay | Admitting: Psychiatry

## 2020-05-02 ENCOUNTER — Ambulatory Visit (INDEPENDENT_AMBULATORY_CARE_PROVIDER_SITE_OTHER): Payer: 59 | Admitting: Psychiatry

## 2020-05-02 ENCOUNTER — Other Ambulatory Visit: Payer: Self-pay

## 2020-05-02 DIAGNOSIS — F4323 Adjustment disorder with mixed anxiety and depressed mood: Secondary | ICD-10-CM | POA: Diagnosis not present
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
## 2020-05-07 ENCOUNTER — Ambulatory Visit: Payer: 59 | Admitting: Psychiatry

## 2020-05-16 ENCOUNTER — Other Ambulatory Visit: Payer: Self-pay

## 2020-05-16 ENCOUNTER — Encounter: Payer: Self-pay | Admitting: Psychiatry

## 2020-05-16 ENCOUNTER — Ambulatory Visit (INDEPENDENT_AMBULATORY_CARE_PROVIDER_SITE_OTHER): Payer: 59 | Admitting: Psychiatry

## 2020-05-16 DIAGNOSIS — F4323 Adjustment disorder with mixed anxiety and depressed mood: Secondary | ICD-10-CM

## 2020-05-16 NOTE — Progress Notes (Signed)
[facility] Counselor/Therapist Progress Note  Patient ID: Alan Bauer, MRN: [id number],    Date: 05/16/2020  Time Spent: 50 minutes   Treatment Type: Individual Therapy  Reported Symptoms: sad, anxious  Mental Status Exam:  Appearance:   Casual     Behavior:  Appropriate  Motor:  Normal  Speech/Language:   Clear and Coherent  Affect:  Appropriate  Mood:  anxious and sad  Thought process:  normal  Thought content:    WNL  Sensory/Perceptual disturbances:    WNL  Orientation:  oriented to person, place, time/date and situation  Attention:  Good  Concentration:  Good  Memory:  WNL  Fund of knowledge:   Good  Insight:    Good  Judgment:   Good  Impulse Control:  Good   Risk Assessment: Danger to Self:  No Self-injurious Behavior: No Danger to Others: No Duty to Warn:no Physical Aggression / Violence:No  Access to Firearms a concern: No  Gang Involvement:No   Subjective: The client has been thinking about what other kinds of jobs he could do beyond his current job.  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  He is not sure if it is the right fit for him.  We also discussed his interest in coffee roasting.  I suggested to the client that he contact commercial roasters in the area to see if there might be positions available?  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  He will put feelers out with the people he knows locally to explore options. The client states that things with his extended family continue to change.  His sister-in-law was finally able to get her documents sent to the [employer] for her green card.  He found out that his younger brother had sent an email to [facility] that he is currently stepping out of ministry.  The client talked to a friend at his at church about his family circumstances.  He found her  very validating in terms of how he thought about the dynamics with his younger brother and sister-in-law.  The client's father wants him to form a property company with him.  The client admitted that he thought this was a poor idea since he is trying to have less contact with his family.  When he agrees to go over to his parents house he states, "I cannot be myself with my family.  I am not valued or pursued."  I asked the client why he could not learn to be himself with his family?  What would be the worst thing that could happen?  The client admitted that nothing probably would have happened.  "I cannot seem to get out of my own way."  I asked the client to pay attention to the things that he does well through the course of days as a way to get a better sense of who he is.  I also used eye-movement with the client around this.  As he processed he became sad and a little anxious.  Validation is important to him.  He also realizes that he needs to trust his judgment ultimately.  His positive cognition at the end of the session was, "I can trust my judgment."  Interventions: Assertiveness/Communication, Motivational Interviewing, Solution-Oriented/Positive Psychology, [employer] Desensitization and Reprocessing (EMDR) and Insight-Oriented  Diagnosis:  ICD-10-CM   1. Adjustment disorder with mixed anxiety and depressed mood  F43.23     Plan: Mood independent behavior, assertiveness, boundaries, encourage self-confidence, explore local commercial real history roasters, think about think about  Tammi Sou, [facility]

## 2020-05-30 ENCOUNTER — Ambulatory Visit: Payer: 59 | Admitting: Psychiatry

## 2020-06-13 ENCOUNTER — Encounter: Payer: Self-pay | Admitting: Psychiatry

## 2020-06-13 ENCOUNTER — Ambulatory Visit (INDEPENDENT_AMBULATORY_CARE_PROVIDER_SITE_OTHER): Payer: 59 | Admitting: Psychiatry

## 2020-06-13 ENCOUNTER — Other Ambulatory Visit: Payer: Self-pay

## 2020-06-13 DIAGNOSIS — F4323 Adjustment disorder with mixed anxiety and depressed mood: Secondary | ICD-10-CM | POA: Diagnosis not present

## 2020-06-13 NOTE — Progress Notes (Signed)
      [facility] Counselor/Therapist Progress Note  Patient ID: Alan Bauer, MRN: [id number],    Date: 06/13/2020  Time Spent: 50 minutes   Treatment Type: Individual Therapy  Reported Symptoms: sadness, anxiety  Mental Status Exam:  Appearance:   Casual     Behavior:  Appropriate  Motor:  Normal  Speech/Language:   Clear and Coherent  Affect:  Appropriate  Mood:  anxious and sad  Thought process:  normal  Thought content:    WNL  Sensory/Perceptual disturbances:    WNL  Orientation:  oriented to person, place, time/date and situation  Attention:  Good  Concentration:  Good  Memory:  WNL  Fund of knowledge:   Good  Insight:    Good  Judgment:   Good  Impulse Control:  Good   Risk Assessment: Danger to Self:  No Self-injurious Behavior: No Danger to Others: No Duty to Warn:no Physical Aggression / Violence:No  Access to Firearms a concern: No  Gang Involvement:No   Subjective: "This last week set me back."  The client's wife contracted COVID-19 and had to quarantine at her mother's house for 8 days.  The client had full care of the children.  He felt like he did pretty well but he realized he had a huge need for time by himself to recharge.  "I did not drink at all which ultimately help me do better with my children."  The client today looks distressed.  He admits that he is not satisfied with where he is in life.  He recently had talked with his dad.  "My dad made it all about him."  The client states in that conversation he was able to redirect his dad when he veered off on other topics.  I pointed out to the client that as an intuitive thinker this was a good skill set that he had.  The ability to cut through the emotional content to the core of what the issue is.  The client agreed that he can do that.  I encouraged the client to focus on embracing more of who he is in understanding himself.  Only then will he have a better direction about which way he should go in life.   His wife returned home last night.  He was grateful to reconnect with her although she is quite fatigued.  The client will refocus on his hobbies such as coffee roasting and trying to increase his walking.  I also encouraged him to reconnect with his friends.  Interventions: Assertiveness/Communication, Motivational Interviewing, Solution-Oriented/Positive Psychology, [employer] Desensitization and Reprocessing (EMDR) and Insight-Oriented  Diagnosis:   ICD-10-CM   1. Adjustment disorder with mixed anxiety and depressed mood  F43.23     Plan: Mood independent behavior, positive self talk, self-care, walking, hobbies, social network, assertiveness, boundaries.  Gelene Mink Osiris Odriscoll, [facility]                 "

## 2020-06-27 ENCOUNTER — Ambulatory Visit: Payer: 59 | Admitting: Psychiatry

## 2020-07-04 ENCOUNTER — Encounter: Payer: Self-pay | Admitting: Psychiatry

## 2020-07-04 ENCOUNTER — Other Ambulatory Visit: Payer: Self-pay

## 2020-07-04 ENCOUNTER — Ambulatory Visit (INDEPENDENT_AMBULATORY_CARE_PROVIDER_SITE_OTHER): Payer: 59 | Admitting: Psychiatry

## 2020-07-04 DIAGNOSIS — F4323 Adjustment disorder with mixed anxiety and depressed mood: Secondary | ICD-10-CM | POA: Diagnosis not present

## 2020-07-04 NOTE — Progress Notes (Signed)
°      [facility] Counselor/Therapist Progress Note  Patient ID: Alan Bauer, MRN: [id number],    Date: 07/04/2020  Time Spent: 50 minutes   Treatment Type: Individual Therapy  Reported Symptoms: sad, anxious  Mental Status Exam:  Appearance:   Casual     Behavior:  Appropriate  Motor:  Normal  Speech/Language:   Clear and Coherent  Affect:  Appropriate  Mood:  anxious and sad  Thought process:  normal  Thought content:    WNL  Sensory/Perceptual disturbances:    WNL  Orientation:  oriented to person, place, time/date and situation  Attention:  Good  Concentration:  Good  Memory:  WNL  Fund of knowledge:   Good  Insight:    Good  Judgment:   Good  Impulse Control:  Good   Risk Assessment: Danger to Self:  No Self-injurious Behavior: No Danger to Others: No Duty to Warn:no Physical Aggression / Violence:No  Access to Firearms a concern: No  Gang Involvement:No   Subjective: The client states that he is anxious and sad in addition to being worn out.  The client, his wife and children all ended up having COVID consecutively.  He has just recently recovered and feels fatigued.  Contributing to his anxiety and sadness is the recent revelation that his sister and brother-in-law are getting divorced.  He also found out today that his mother-in-law and father-in-law are divorcing.  "They are all so stressful." The client also states that he and his wife are hashing out his lack of support for her over the last 10 years of their marriage.  It has only been recently that the client has come to understand how unprepared he was emotionally for adulthood and marriage.  He did not understand how his own childhood impacted his inability to protect his wife or stand up for her.  Today I used eye-movement with the client focusing on the sadness that he feels.  His negative cognition is, "I did not get it at the time."  He feels hurt and sadness in his chest.  His subjective units of distress is  a 5.  As the client processed he realized that he and his wife have some good qualities going for them.  They are able to communicate well and both have good emotional intelligence.  I suggested to the client that he journal out what he remembers from his childhood maybe taking it in segments for example, from 39 years old up to 6 grade and then sixth grade through 12th grade and so forth.  In each section trying to remember what he experienced and what his thoughts were around those experiences.  Then he can go to his wife and discuss his thought process where it came from and how he is changing it.  He can also ask her questions, "what can I share?"  The client's subjective units of distress was less than 2 at the end of the session.  He thought journaling was a great idea.  Interventions: Assertiveness/Communication, Motivational Interviewing, Solution-Oriented/Positive Psychology, [employer] Desensitization and Reprocessing (EMDR) and Insight-Oriented  Diagnosis:   ICD-10-CM   1. Adjustment disorder with mixed anxiety and depressed mood  F43.23     Plan: Journal, mood independent behavior, positive self talk, self-care, assertiveness, boundaries.  Alan Bauer, [facility]

## 2020-07-11 ENCOUNTER — Ambulatory Visit: Payer: 59 | Admitting: Psychiatry

## 2020-07-24 ENCOUNTER — Encounter: Payer: Self-pay | Admitting: Psychiatry

## 2020-07-24 ENCOUNTER — Ambulatory Visit (INDEPENDENT_AMBULATORY_CARE_PROVIDER_SITE_OTHER): Payer: 59 | Admitting: Psychiatry

## 2020-07-24 ENCOUNTER — Other Ambulatory Visit: Payer: Self-pay

## 2020-07-24 DIAGNOSIS — F4323 Adjustment disorder with mixed anxiety and depressed mood: Secondary | ICD-10-CM | POA: Diagnosis not present

## 2020-07-24 NOTE — Progress Notes (Signed)
      [facility] Counselor/Therapist Progress Note  Patient ID: Alan Bauer, MRN: [id number],    Date: 07/24/2020  Time Spent: 50 minutes   Treatment Type: Individual Therapy  Reported Symptoms: anxious, irritable  Mental Status Exam:  Appearance:   Casual     Behavior:  Appropriate  Motor:  Normal  Speech/Language:   Clear and Coherent  Affect:  Appropriate  Mood:  anxious and irritable  Thought process:  normal  Thought content:    WNL  Sensory/Perceptual disturbances:    WNL  Orientation:  oriented to person, place, time/date and situation  Attention:  Good  Concentration:  Good  Memory:  WNL  Fund of knowledge:   Good  Insight:    Good  Judgment:   Good  Impulse Control:  Good   Risk Assessment: Danger to Self:  No Self-injurious Behavior: No Danger to Others: No Duty to Warn:no Physical Aggression / Violence:No  Access to Firearms a concern: No  Gang Involvement:No   Subjective: The client states that his wife is still in process with an application as a coach in an Consulting civil engineer company.  "I will be able to stay home with the kids, do my projects and side work."  The client is nervous about this working out.  Today he states he has not had much sleep.  He feels a little on the irritable side.  His son is transitioning to a "big boy bed".  With his son being up he has not slept much. The client states that in his extended family his brother has been calling him to discuss what discipline was like for them as children.  He finds this very interesting as his family continues to fall apart in different ways.  The client states that his interaction with his mother has been more positive.  "She has been more normal."  His dad seems to be more out of sorts especially with the client's youngest sister's recent separation and impending divorce.  The client is focusing on his self-care.  He states he has been playing his guitar more.  He has been roasting coffee which is a good creative  outlet for him.  He would like the opportunity to be able to do more projects around his house.  He is taking walks in the evenings as a way to decompress after work.  He is also using more mood independent behavior.  Interventions: Assertiveness/Communication, Motivational Interviewing, Solution-Oriented/Positive Psychology and Insight-Oriented  Diagnosis:   ICD-10-CM   1. Adjustment disorder with mixed anxiety and depressed mood  F43.23     Plan: Positive self talk, self-care, evening walks, mood independent behavior, creative outlets, assertiveness, boundaries.  Gelene Mink Elvenia Godden, [facility]

## 2020-08-02 ENCOUNTER — Ambulatory Visit (INDEPENDENT_AMBULATORY_CARE_PROVIDER_SITE_OTHER): Payer: 59 | Admitting: Psychiatry

## 2020-08-02 ENCOUNTER — Other Ambulatory Visit: Payer: Self-pay

## 2020-08-02 DIAGNOSIS — F4323 Adjustment disorder with mixed anxiety and depressed mood: Secondary | ICD-10-CM

## 2020-08-03 ENCOUNTER — Encounter: Payer: Self-pay | Admitting: Psychiatry

## 2020-08-03 NOTE — Progress Notes (Signed)
°      [facility] Counselor/Therapist Progress Note  Patient ID: Alan Bauer, MRN: [id number],    Date: 08/02/2020  Time Spent: 50 minutes   Treatment Type: Individual Therapy  Reported Symptoms: sad, anxious  Mental Status Exam:  Appearance:   Casual     Behavior:  Appropriate  Motor:  Normal  Speech/Language:   Clear and Coherent  Affect:  Appropriate  Mood:  anxious and sad  Thought process:  normal  Thought content:    WNL  Sensory/Perceptual disturbances:    WNL  Orientation:  oriented to person, place, time/date and situation  Attention:  Good  Concentration:  Good  Memory:  WNL  Fund of knowledge:   Good  Insight:    Good  Judgment:   Good  Impulse Control:  Good   Risk Assessment: Danger to Self:  No Self-injurious Behavior: No Danger to Others: No Duty to Warn:no Physical Aggression / Violence:No  Access to Firearms a concern: No  Gang Involvement:No   Subjective: The client states that he is down and discouraged today.  His wife did not get the job that they had hoped for.  As a result his plans for being able to stay home and work on his creative endeavors is put on hold for the time being.  Today I used eye-movement focusing on the client's sense of discouragement and anxiety.  As he processed he saw that it is also a seasonal discontent.  I pointed out to the client that he was very creative and does not seem to have the outlets that he used to.  The client agreed.  I encouraged the client to pursue those more intentionally.  I also suggested that this will process with his wife has opened up the possibility of pursuing avenues they had not thought about before.  The client agreed.  As he made this connection his subjective units of distress went from a suds of 5 to less than 2.  The client will have some conversations with his wife about this.  He will also focus on his self-care and positive self talk as well as being mindful about his day.  Interventions:  Assertiveness/Communication, Mindfulness Meditation, Motivational Interviewing, Solution-Oriented/Positive Psychology, [employer] Desensitization and Reprocessing (EMDR) and Insight-Oriented  Diagnosis:   ICD-10-CM   1. Adjustment disorder with mixed anxiety and depressed mood  F43.23     Plan: Mindfulness, mood independent behavior, positive self talk, self-care, assertiveness, boundaries, exercise.  Gelene Mink Nikiya Starn, [facility]

## 2020-08-17 ENCOUNTER — Ambulatory Visit: Payer: 59 | Admitting: Psychiatry

## 2020-08-29 ENCOUNTER — Ambulatory Visit (INDEPENDENT_AMBULATORY_CARE_PROVIDER_SITE_OTHER): Payer: 59 | Admitting: Psychiatry

## 2020-08-29 ENCOUNTER — Other Ambulatory Visit: Payer: Self-pay

## 2020-08-29 ENCOUNTER — Encounter: Payer: Self-pay | Admitting: Psychiatry

## 2020-08-29 DIAGNOSIS — F4323 Adjustment disorder with mixed anxiety and depressed mood: Secondary | ICD-10-CM

## 2020-08-29 NOTE — Progress Notes (Signed)
[facility] Counselor/Therapist Progress Note  Patient ID: Jonathin Heinicke, MRN: [id number],    Date: 08/29/2020  Time Spent: 50 minutes   Treatment Type: Individual Therapy  Reported Symptoms: anxious, sad  Mental Status Exam:  Appearance:   Casual     Behavior:  Appropriate  Motor:  Normal  Speech/Language:   Clear and Coherent  Affect:  Appropriate  Mood:  anxious and sad  Thought process:  normal  Thought content:    WNL  Sensory/Perceptual disturbances:    WNL  Orientation:  oriented to person, place, time/date and situation  Attention:  Good  Concentration:  Good  Memory:  WNL  Fund of knowledge:   Good  Insight:    Good  Judgment:   Good  Impulse Control:  Good   Risk Assessment: Danger to Self:  No Self-injurious Behavior: No Danger to Others: No Duty to Warn:no Physical Aggression / Violence:No  Access to Firearms a concern: No  Gang Involvement:No   Subjective: The client states that he has been retreating into "my mind palace".  He states things have been particularly hard the last few months.  Recently he went up to see a friend in [place].  "Once I got on [address] I felt better.  I felt free."  I asked the client when all of this seemed to start in earnest for him?  He felt it started with conversations with his brother helping him put together pieces of their childhood.  The client also identifies what he calls "trauma bookmarks in the city".  He also feels that he has a lack of good boundaries at work. "I realize I need a change for sure."  Today I started with eye-movement with the client around his sense of sadness and anxiety about where he is.  He identified an anger process with his brother.  "He is so much like my dad.  He does what he wants in spite of what needs to be done."  As the client processed his subjective units of distress were at a 7.  "I feel sad about my childhood ".  He sees how he was raised by his parents resulted in him having a life  toolbox that is suspiciously light.  The client is creative and talented with music as well as with mechanical things.  None of that was really nurtured in him growing up.  The client has some underlying anger at what he did not get.  As we continue to process I suggested to the client that we make a shift to begin to focus on who he is an the skills and abilities that he has.  I pointed out that most of the things that come across his plate not only will he be able to figure out but he will have the resources or the finances to figure it out.  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  The client became tearful as he thought about this but agreed.  "I can nurture myself."  Interventions: Assertiveness/Communication, Motivational Interviewing, Solution-Oriented/Positive Psychology, [employer] Desensitization and Reprocessing (EMDR) and Insight-Oriented  Diagnosis:   ICD-10-CM   1. Adjustment disorder with mixed anxiety and depressed mood  F43.23     Plan: Self-care, positive self talk, engaged activities, social network, assertiveness, boundaries, exercise, think about what his next steps and work should be.  Gelene Mink Bob Daversa, [facility]

## 2020-09-12 ENCOUNTER — Other Ambulatory Visit: Payer: Self-pay

## 2020-09-12 ENCOUNTER — Encounter: Payer: Self-pay | Admitting: Psychiatry

## 2020-09-12 ENCOUNTER — Ambulatory Visit (INDEPENDENT_AMBULATORY_CARE_PROVIDER_SITE_OTHER): Payer: 59 | Admitting: Psychiatry

## 2020-09-12 DIAGNOSIS — F4323 Adjustment disorder with mixed anxiety and depressed mood: Secondary | ICD-10-CM

## 2020-09-12 NOTE — Progress Notes (Signed)
      [facility] Counselor/Therapist Progress Note  Patient ID: Alan Bauer, MRN: [id number],    Date: 09/12/2020  Time Spent: 50 minutes   Treatment Type: Individual Therapy  Reported Symptoms: anxiety, sadness, irritability  Mental Status Exam:  Appearance:   Casual     Behavior:  Appropriate  Motor:  Normal  Speech/Language:   Clear and Coherent  Affect:  Appropriate  Mood:  anxious, irritable and sad  Thought process:  normal  Thought content:    WNL  Sensory/Perceptual disturbances:    WNL  Orientation:  oriented to person, place, time/date and situation  Attention:  Good  Concentration:  Good  Memory:  WNL  Fund of knowledge:   Good  Insight:    Good  Judgment:   Good  Impulse Control:  Good   Risk Assessment: Danger to Self:  No Self-injurious Behavior: No Danger to Others: No Duty to Warn:no Physical Aggression / Violence:No  Access to Firearms a concern: No  Gang Involvement:No   Subjective: 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  This has been one of the ways that he is taking care of himself.  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  He does not like the position but deals with it the best that he can.  "Sometimes I just feel like a hurt child in a man's body." The client states that his sister pulled in behind him at his job crying.  She was telling him that her husband that she is separated from is now dating someone in his office.  She accused him of infidelity.  His mom walked out at the same time trying not to hear the conversation.  Client found it all frustrating and disconcerting.  Then the client had an interaction with his brother offering him tickets to the [employer].  All of this makes the client sad and angry.  It also increases his anxiety.  Today I used eye-movement focusing on his emotions.  His negative cognition is, "it is all too much."  He feels sadness, irritability and  anxiety in his chest.  As the client processed his subjective units of distress was a 5.  His wife tells him that he does not need to fight through things but just move through things.  He is not quite sure what that means.  I suggested to the client that maybe he needs to have a more pragmatic view of his family (radical acceptance).  Have more of an expectation that they will act the way they are so it does not surprise him or catch him off guard.  Also to focus on his own individuation that who he is is not what his family tries to define him.  This makes sense to the client and he will work on moving into that.  His subjective units of distress at the end of the session was 3.  Interventions: Assertiveness/Communication, Motivational Interviewing, Solution-Oriented/Positive Psychology, [employer] Desensitization and Reprocessing (EMDR) and Insight-Oriented  Diagnosis:   ICD-10-CM   1. Adjustment disorder with mixed anxiety and depressed mood  F43.23     Plan: Individuation, positive self talk, self-care, radical acceptance, assertiveness, boundaries, exercise.  Gelene Mink Elleanna Melling, [facility]

## 2020-10-04 ENCOUNTER — Other Ambulatory Visit: Payer: Self-pay

## 2020-10-04 ENCOUNTER — Encounter: Payer: Self-pay | Admitting: Psychiatry

## 2020-10-04 ENCOUNTER — Ambulatory Visit (INDEPENDENT_AMBULATORY_CARE_PROVIDER_SITE_OTHER): Payer: 59 | Admitting: Psychiatry

## 2020-10-04 DIAGNOSIS — F4323 Adjustment disorder with mixed anxiety and depressed mood: Secondary | ICD-10-CM

## 2020-10-04 NOTE — Progress Notes (Signed)
      [facility] Counselor/Therapist Progress Note  Patient ID: Alan Bauer, MRN: [id number],    Date: 10/04/2020  Time Spent: 50 minutes   Treatment Type: Individual Therapy  Reported Symptoms: anxiety, sadness  Mental Status Exam:  Appearance:   Casual     Behavior:  Appropriate  Motor:  Normal  Speech/Language:   Clear and Coherent  Affect:  Appropriate  Mood:  anxious and sad  Thought process:  normal  Thought content:    WNL  Sensory/Perceptual disturbances:    WNL  Orientation:  oriented to person, place, time/date and situation  Attention:  Good  Concentration:  Good  Memory:  WNL  Fund of knowledge:   Good  Insight:    Good  Judgment:   Good  Impulse Control:  Good   Risk Assessment: Danger to Self:  No Self-injurious Behavior: No Danger to Others: No Duty to Warn:no Physical Aggression / Violence:No  Access to Firearms a concern: No  Gang Involvement:No   Subjective: The client states that he has had more interactions with his mom due to issues with his younger sister and her separated husband dating people.  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  The client states that all of this wears him out emotionally and he finds it hard to reset.  I used eye-movement with the client as we were talking about these things.  His subjective units of distress is a 6+.  His negative cognition is, "I lack confidence.  Or I will fail."  As the client processed, he realized how much hesitation or paralysis he feels inside of himself in his chest and gut.  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  It potentially is a six-figure income and the client is very interested.  He feels a lot of anxiety about applying because he does not meet all the qualifications.  I challenged the client that once people meet him that  they would want to hire him because he has so many positive qualities.  He is very much of an affable personality and people like him very easily.  He would be easy for people to buy from because they could trust him.  The client had some difficulty accepting all of this.  I challenged him to think carefully and try to walk in that acceptance.  He agreed.  Interventions: Assertiveness/Communication, Mindfulness Meditation, Motivational Interviewing, Solution-Oriented/Positive Psychology, [employer] Desensitization and Reprocessing (EMDR) and Insight-Oriented  Diagnosis:   ICD-10-CM   1. Adjustment disorder with mixed anxiety and depressed mood  F43.23     Plan: Mindfulness, mood independent behavior, positive self talk, radical acceptance, boundaries, assertiveness, self-care.  Gelene Mink Orlando Thalmann, [facility]

## 2020-10-15 ENCOUNTER — Encounter: Payer: Self-pay | Admitting: Psychiatry

## 2020-10-15 ENCOUNTER — Ambulatory Visit (INDEPENDENT_AMBULATORY_CARE_PROVIDER_SITE_OTHER): Payer: 59 | Admitting: Psychiatry

## 2020-10-15 ENCOUNTER — Other Ambulatory Visit: Payer: Self-pay

## 2020-10-15 DIAGNOSIS — F4323 Adjustment disorder with mixed anxiety and depressed mood: Secondary | ICD-10-CM | POA: Diagnosis not present

## 2020-10-15 NOTE — Progress Notes (Signed)
      [facility] Counselor/Therapist Progress Note  Patient ID: Nikolaus Pienta, MRN: [id number],    Date: 10/15/2020  Time Spent: 50 minutes   Treatment Type: Individual Therapy  Reported Symptoms: anxiety, sadness  Mental Status Exam:  Appearance:   Well Groomed     Behavior:  Appropriate  Motor:  Normal  Speech/Language:   Clear and Coherent  Affect:  Appropriate  Mood:  anxious and sad  Thought process:  normal  Thought content:    WNL  Sensory/Perceptual disturbances:    WNL  Orientation:  oriented to person, place, time/date and situation  Attention:  Good  Concentration:  Good  Memory:  WNL  Fund of knowledge:   Good  Insight:    Good  Judgment:   Good  Impulse Control:  Good   Risk Assessment: Danger to Self:  No Self-injurious Behavior: No Danger to Others: No Duty to Warn:no Physical Aggression / Violence:No  Access to Firearms a concern: No  Gang Involvement:No   Subjective: The client states that he is not applying for the job in [place], [place].  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  "I am not in the head space I need to be."  Today the client feels discontent.  He has stopped drinking and trying to be more physically active.  He is also trying to decrease his stress eating.  Today he identifies his stress at a subjective units of distress of 4.  He thinks that it comes out of two issues.  One is his family of origin and the other is the institution of the church.  He feels some apprehension and some sadness connected to that.  Today I used eye-movement with the client focusing on these issues.  His negative cognition is, "I feel trapped."  He feels his emotions in his chest.  It is a subjective units of distress of 4+.  As the client processed he felt an increased annoyance with things at the church.  He realizes that he has never done anything outside of the institution of the church.  "I need some separation and some freedom."  I asked the  client what kind of training he would need to be able to move forward?  He is unsure.  As we discussed it he Toula Miyasaki pursue real estate.  I suggested to the client that he check out the catalog for the local community college.  They also have a career counseling center on campus in [place], [place].  This is open and free to all [facility] residents.  The client agrees to pursue this.  His subjective units of distress at the end of the session was less than 2.  Interventions: Assertiveness/Communication, Motivational Interviewing, Solution-Oriented/Positive Psychology, [employer] Desensitization and Reprocessing (EMDR) and Insight-Oriented  Diagnosis:   ICD-10-CM   1. Adjustment disorder with mixed anxiety and depressed mood  F43.23     Plan: Mood independent behavior, positive self talk, self-care, look at [employer], access the career counseling center at [facility], exercise.  Gelene Mink Cristian Davitt, [facility]

## 2020-10-31 ENCOUNTER — Encounter: Payer: Self-pay | Admitting: Psychiatry

## 2020-10-31 ENCOUNTER — Other Ambulatory Visit: Payer: Self-pay

## 2020-10-31 ENCOUNTER — Ambulatory Visit (INDEPENDENT_AMBULATORY_CARE_PROVIDER_SITE_OTHER): Payer: 59 | Admitting: Psychiatry

## 2020-10-31 DIAGNOSIS — F4323 Adjustment disorder with mixed anxiety and depressed mood: Secondary | ICD-10-CM

## 2020-10-31 NOTE — Progress Notes (Signed)
[facility] Counselor/Therapist Progress Note  Patient ID: Alan Bauer, MRN: [id number],    Date: 10/31/2020  Time Spent: 50 minutes   Treatment Type: Individual Therapy  Reported Symptoms: anxiety, sadness, depressed mood, irritated  Mental Status Exam:  Appearance:   Casual     Behavior:  Appropriate  Motor:  Normal  Speech/Language:   Clear and Coherent  Affect:  Appropriate  Mood:  anxious, depressed, labile and sad  Thought process:  normal  Thought content:    WNL  Sensory/Perceptual disturbances:    WNL  Orientation:  oriented to person, place, time/date and situation  Attention:  Good  Concentration:  Good  Memory:  WNL  Fund of knowledge:   Good  Insight:    Good  Judgment:   Good  Impulse Control:  Good   Risk Assessment: Danger to Self:  No Self-injurious Behavior: No Danger to Others: No Duty to Warn:no Physical Aggression / Violence:No  Access to Firearms a concern: No  Gang Involvement:No   Subjective: The client stated his extended family had COVID exposure during Christmas.  He states he and his family were able to avoid that.  Today the client states he feels frustrated and depressed looking at jobs.  He is not sure what direction to go in.  "I feel defeated with the job search."  Today I used eye-movement with the client focusing on his job search.  His negative cognition, "I am defeated.  What do I do?"  He feels discouraged and anxious in his chest.  His subjective units of distress is a 6+.  As the client processed, I asked him why he felt like he was not able to move forward?  It essentially came down to him not having the confidence in himself to do what he perceives needed to be done.  He stated that when he was in high school heading to college he was told he could do whatever he wanted but was presented with only limited options.  The client is clearly talented but seems to be unconsciously unwilling to take the risk.  When I discussed this with  the client he agreed that this might be true.  We then discussed using mood independent behavior to begin to overcome some of this.  The client identified that his belief comes out of, "the [place] family way".  I asked the client how well that has been working for him?  He says it has not.  He notes that he can be a big picture guy but does not want to do all the details because he feels it will take too much time.  I encouraged the client to follow through a complete projects that he has done three quarters of the way.  He agreed.  He needs to not look at the whole project but one step at a time.  His positive cognition at the end of the session was, "I need to get out of my head and do stuff."  His subjective units of distress was less than 4 at the end of the session.  Interventions: Motivational Interviewing, Solution-Oriented/Positive Psychology, [employer] Desensitization and Reprocessing (EMDR) and Insight-Oriented  Diagnosis:   ICD-10-CM   1. Adjustment disorder with mixed anxiety and depressed mood  F43.23     Plan: Mood independent behavior, positive self talk, complete projects, one step at a time, self-care, exercise, explore trainings that Latandra Loureiro be helpful.  Gelene Mink Juana Haralson, [facility]

## 2020-11-14 ENCOUNTER — Other Ambulatory Visit: Payer: Self-pay

## 2020-11-14 ENCOUNTER — Ambulatory Visit (INDEPENDENT_AMBULATORY_CARE_PROVIDER_SITE_OTHER): Payer: 59 | Admitting: Psychiatry

## 2020-11-14 ENCOUNTER — Encounter: Payer: Self-pay | Admitting: Psychiatry

## 2020-11-14 DIAGNOSIS — F4323 Adjustment disorder with mixed anxiety and depressed mood: Secondary | ICD-10-CM | POA: Diagnosis not present

## 2020-11-14 NOTE — Progress Notes (Signed)
      [facility] Counselor/Therapist Progress Note  Patient ID: Alan Bauer, MRN: [id number],    Date: 11/14/2020  Time Spent: 50 minutes   Treatment Type: Individual Therapy  Reported Symptoms: anxiety, sadness  Mental Status Exam:  Appearance:   Casual     Behavior:  Appropriate  Motor:  Normal  Speech/Language:   Clear and Coherent  Affect:  Appropriate  Mood:  anxious and sad  Thought process:  normal  Thought content:    WNL  Sensory/Perceptual disturbances:    WNL  Orientation:  oriented to person, place, time/date and situation  Attention:  Good  Concentration:  Good  Memory:  WNL  Fund of knowledge:   Good  Insight:    Good  Judgment:   Good  Impulse Control:  Good   Risk Assessment: Danger to Self:  No Self-injurious Behavior: No Danger to Others: No Duty to Warn:no Physical Aggression / Violence:No  Access to Firearms a concern: No  Gang Involvement:No   Subjective: The client states that he will be in a single parent role from today until Sunday when his wife returns from being out of town.  He states his oldest daughter has been having anxiety issues which he has been able to help her with.  I suggested that if her anxiety gets bad to encourage her to sing since that is a mindful activity and it is difficult to be anxious while singing. The client sent out a bunch of resumes and applying to some start ups.  He is hopeful to get an interview.  Things have changed at his work.  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  They are asking the employees to sign a statement of faith.  The client cannot do this in good conscience and does not know what that means for his future.  This has increased his worry.  So currently he is trying to be productive but he is not hanging out with friends.  He notes that last week he had more family interaction with both his brother and his dad.  He states that increased his overall sadness.  Just being around his family and  experiencing their dysfunction weighs him down. I discussed with the client that today things are okay.  He agreed.  I asked the client if he was moving in the right direction?  He stated that he was.  I also suggested that I would doubt he would lose his job over not signing a statement of faith.  He agreed.  I reminded the client that it was important to be mindful and stay in the present tense.  He agreed.  His subjective units of distress went from a 4+ to less than 1.  Interventions: Assertiveness/Communication, Mindfulness Meditation, Motivational Interviewing, Solution-Oriented/Positive Psychology, [employer] Desensitization and Reprocessing (EMDR) and Insight-Oriented  Diagnosis:   ICD-10-CM   1. Adjustment disorder with mixed anxiety and depressed mood  F43.23     Plan: Mindfulness, mood independent behavior, self-care, exercise, engaged activities, social network, assertiveness, boundaries.  Alan Bauer, [facility]

## 2020-11-28 ENCOUNTER — Encounter: Payer: Self-pay | Admitting: Psychiatry

## 2020-11-28 ENCOUNTER — Ambulatory Visit (INDEPENDENT_AMBULATORY_CARE_PROVIDER_SITE_OTHER): Payer: 59 | Admitting: Psychiatry

## 2020-11-28 ENCOUNTER — Other Ambulatory Visit: Payer: Self-pay

## 2020-11-28 DIAGNOSIS — F4323 Adjustment disorder with mixed anxiety and depressed mood: Secondary | ICD-10-CM

## 2020-11-28 NOTE — Progress Notes (Signed)
      [facility] Counselor/Therapist Progress Note  Patient ID: Alan Bauer, MRN: [id number],    Date: 11/28/2020  Time Spent: 50 minutes   Treatment Type: Individual Therapy  Reported Symptoms: anxiety, sad  Mental Status Exam:  Appearance:   Casual     Behavior:  Appropriate  Motor:  Normal  Speech/Language:   Clear and Coherent  Affect:  Appropriate  Mood:  anxious and sad  Thought process:  normal  Thought content:    WNL  Sensory/Perceptual disturbances:    WNL  Orientation:  oriented to person, place, time/date and situation  Attention:  Good  Concentration:  Good  Memory:  WNL  Fund of knowledge:   Good  Insight:    Good  Judgment:   Good  Impulse Control:  Good   Risk Assessment: Danger to Self:  No Self-injurious Behavior: No Danger to Others: No Duty to Warn:no Physical Aggression / Violence:No  Access to Firearms a concern: No  Gang Involvement:No   Subjective: The client states that he had a discussion with his boss about his job.  "Is this what he wanted long-term?  The client was able to tell his boss that it is not what he wants.  His boss gave the client a week off to think about what direction he wants to go.  "I left with an incredible sense of relief."  He states over this past week he has been following up with people to see if he can land a job that he really wants to do.  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  They service restaurants and coffee shops. Overall the client's mood is good.  "It all feels daunting."  He describes his family dynamics is still being off.  Today we used eye-movement focusing on the family dynamics and future work.  The client feels uncertainty.  His subjective units of distress is a 4.  He feels it in his chest.  His negative cognition is, "do my skills translate?"  As the client processed he understood that he is a  big picture guy.  His ability is to be able to coordinate a number of different processes.  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  As the client grasped this idea his subjective units of distress went to less than 2.  He also understood that his family was not changing any times and he would have to develop radical acceptance with that.  The client will continue to pursue searches for jobs.  His positive cognition was, "I can do this."  Interventions: Assertiveness/Communication, Motivational Interviewing, Solution-Oriented/Positive Psychology, Eye Movement Desensitization and Reprocessing (EMDR) and Insight-Oriented  Diagnosis:   ICD-10-CM   1. Adjustment disorder with mixed anxiety and depressed mood  F43.23     Plan: Job search, mood independent behavior, self-care, assertiveness, boundaries  [place], [facility]

## 2020-12-19 ENCOUNTER — Other Ambulatory Visit: Payer: Self-pay

## 2020-12-19 ENCOUNTER — Ambulatory Visit (INDEPENDENT_AMBULATORY_CARE_PROVIDER_SITE_OTHER): Payer: 59 | Admitting: Psychiatry

## 2020-12-19 ENCOUNTER — Encounter: Payer: Self-pay | Admitting: Psychiatry

## 2020-12-19 DIAGNOSIS — F4323 Adjustment disorder with mixed anxiety and depressed mood: Secondary | ICD-10-CM | POA: Diagnosis not present

## 2020-12-19 NOTE — Progress Notes (Signed)
      [facility] Counselor/Therapist Progress Note  Patient ID: Selim Durden, MRN: [id number],    Date: 12/19/2020  Time Spent: 50 minutes   Treatment Type: Individual Therapy  Reported Symptoms: anxiety, irritability, sadness  Mental Status Exam:  Appearance:   Casual and Well Groomed     Behavior:  Appropriate  Motor:  Normal  Speech/Language:   Clear and Coherent  Affect:  Appropriate  Mood:  anxious, irritable and sad  Thought process:  normal  Thought content:    WNL  Sensory/Perceptual disturbances:    WNL  Orientation:  oriented to person, place, time/date and situation  Attention:  Good  Concentration:  Good  Memory:  WNL  Fund of knowledge:   Good  Insight:    Good  Judgment:   Good  Impulse Control:  Good   Risk Assessment: Danger to Self:  No Self-injurious Behavior: No Danger to Others: No Duty to Warn:no Physical Aggression / Violence:No  Access to Firearms a concern: No  Gang Involvement:No   Subjective: The client states that he will be leaving his job by Cloyde Oregel 1.  He will have 6 weeks of severance pay and insurance coverage.  He feels that he is not doing great today.  He had an incident last week with his children where he lost his temper and broke a mason jar glass.  His children would not hurt but he was ashamed of himself for losing control.  His wife is having ongoing medical issues due to hormones.  He is hopeful that the IUD that is being prescribed will solve the problem and get his wife back on her feet. Today I used eye-movement with the client focusing on his current stress of trying to find work and support his family.  He feels anxiety, stress and frustration in his shoulders and neck.  His subjective units of distress is an 8+.  His negative cognition is, "am I hireable.  Am I am qualified?"  As the client processed he has already started making plans to attend a coffee festival in [place].  This is a trade show that should put him in touch  with vendors that he could possibly work for.  He is meeting a friend who works in [employer] there.  He is offering to introduce the client.  We also discussed how the client tends to take a passive role.  He agreed I discussed that he needed to move into a more intentional mode.  I encouraged the client to write down all the different things that he can do and the skills associated with them.  Then he can begin to integrate and see that he is bright and talented.  The client agreed.  His positive cognition at the end of the session was, "I can be intentional."  His subjective units of distress was less than 3.  Interventions: Assertiveness/Communication, Motivational Interviewing, Solution-Oriented/Positive Psychology, [employer] Desensitization and Reprocessing (EMDR) and Insight-Oriented  Diagnosis:   ICD-10-CM   1. Adjustment disorder with mixed anxiety and depressed mood  F43.23     Plan: Make a list of skill set, mood independent behavior, assertiveness, boundaries, self-care.  Gelene Mink Caprice Mccaffrey, [facility]

## 2020-12-31 ENCOUNTER — Ambulatory Visit: Payer: 59 | Admitting: Psychiatry

## 2021-01-14 ENCOUNTER — Ambulatory Visit (INDEPENDENT_AMBULATORY_CARE_PROVIDER_SITE_OTHER): Payer: 59 | Admitting: Psychiatry

## 2021-01-14 ENCOUNTER — Other Ambulatory Visit: Payer: Self-pay

## 2021-01-14 ENCOUNTER — Encounter: Payer: Self-pay | Admitting: Psychiatry

## 2021-01-14 DIAGNOSIS — F4323 Adjustment disorder with mixed anxiety and depressed mood: Secondary | ICD-10-CM

## 2021-01-14 NOTE — Progress Notes (Signed)
      [facility] Counselor/Therapist Progress Note  Patient ID: Plez Belton, MRN: [id number],    Date: 01/14/2021  Time Spent: 45 minutes   Treatment Type: Individual Therapy  Reported Symptoms: anxiety, irritation  Mental Status Exam:  Appearance:   Casual     Behavior:  Appropriate  Motor:  Normal  Speech/Language:   Clear and Coherent  Affect:  Appropriate  Mood:  anxious and irritable  Thought process:  normal  Thought content:    WNL  Sensory/Perceptual disturbances:    WNL  Orientation:  oriented to person, place, time/date and situation  Attention:  Good  Concentration:  Good  Memory:  WNL  Fund of knowledge:   Good  Insight:    Good  Judgment:   Good  Impulse Control:  Good   Risk Assessment: Danger to Self:  No Self-injurious Behavior: No Danger to Others: No Duty to Warn:no Physical Aggression / Violence:No  Access to Firearms a concern: No  Gang Involvement:No   Subjective: The client reports that his exploratory trip to the [place] coffee festival went very well.  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  Currently he is looking for a base job.  Today we used eye-movement focusing on the client's thought, "how can I keep things moving?"  As the client processed he felt his optimism increasing.  "This can actually work."  He is meeting with other small business owners in the city.  He also plans to attend a national coffee convention in [place], [place] in April.  He hopes at that point to be able to get a job as a rep for one of the companies. The client continues to struggle with his family of origin issues.  His brothers wife is isolated and alone with her 2 girls in an apartment.  They barely have enough food which upsets the client.  He and his wife have rallied friends and provided some cash for his sister-in-law to purchase the food she needs for her family.  The client is disappointed  with how his parents have treated the situation.  The client understands that he can only do so much.  He is focusing on radical acceptance with the circumstances.  He and his wife are doing what they can to help with her sister-in-law.  He is setting boundaries as necessary and decreasing some contact with the family since he finds it so difficult.  Interventions: Assertiveness/Communication, Motivational Interviewing, Solution-Oriented/Positive Psychology, [employer] Desensitization and Reprocessing (EMDR) and Insight-Oriented  Diagnosis:   ICD-10-CM   1. Adjustment disorder with mixed anxiety and depressed mood  F43.23     Plan: Work on growing coffee roasting business, meet with other entrepreneurs, boundaries, assertiveness, self-care, exercise, job search, radical acceptance.  Gelene Mink Rowan Blaker, [facility]

## 2021-01-31 ENCOUNTER — Ambulatory Visit (INDEPENDENT_AMBULATORY_CARE_PROVIDER_SITE_OTHER): Payer: 59 | Admitting: Psychiatry

## 2021-01-31 ENCOUNTER — Other Ambulatory Visit: Payer: Self-pay

## 2021-01-31 ENCOUNTER — Encounter: Payer: Self-pay | Admitting: Psychiatry

## 2021-01-31 DIAGNOSIS — F4323 Adjustment disorder with mixed anxiety and depressed mood: Secondary | ICD-10-CM | POA: Diagnosis not present

## 2021-01-31 NOTE — Progress Notes (Signed)
      [facility] Counselor/Therapist Progress Note  Patient ID: Alan Bauer, MRN: [id number],    Date: 01/31/2021  Time Spent: 45 minutes   Treatment Type: Individual Therapy  Reported Symptoms: sad,  anxious  Mental Status Exam:  Appearance:   Casual     Behavior:  Appropriate  Motor:  Normal  Speech/Language:   Clear and Coherent  Affect:  Appropriate  Mood:  anxious and sad  Thought process:  normal  Thought content:    WNL  Sensory/Perceptual disturbances:    WNL  Orientation:  oriented to person, place, time/date and situation  Attention:  Good  Concentration:  Good  Memory:  WNL  Fund of knowledge:   Good  Insight:    Good  Judgment:   Good  Impulse Control:  Good   Risk Assessment: Danger to Self:  No Self-injurious Behavior: No Danger to Others: No Duty to Warn:no Physical Aggression / Violence:No  Access to Firearms a concern: No  Gang Involvement:No   Subjective: "I am officially out of work."  The client is taking some time off to decompress since he has an income for the next 2 months.  There are some dynamics that his wife and he have had with another couple that has been both sad and anxiety provoking.  The client is trying to walk through that carefully since the other couple it is divorcing. The client is currently looking at getting an interview with a logistics company.  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  He is fairly confident he will be able to find something in the interim to produce money for his family.  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  Overall the client feels that he has more confidence in himself.  He has developed a very good social network.  He has not engaged with his family of origin as much which he thinks has been positive for his mood.  Today the client will focus on increasing his self-care especially exercise.  We  discussed some interventions that he can use with his children more effectively.  He is going to tweak his resume so he can get some interviews in the next month or so.  Interventions: Assertiveness/Communication, Motivational Interviewing, Solution-Oriented/Positive Psychology and Insight-Oriented  Diagnosis:   ICD-10-CM   1. Adjustment disorder with mixed anxiety and depressed mood  F43.23     Plan: Mood independent behavior, work on resume, apply for interviews, positive self talk, self-care, exercise, parenting the children.  Gelene Mink Oluwaferanmi Wain, [facility]

## 2021-02-14 ENCOUNTER — Ambulatory Visit (INDEPENDENT_AMBULATORY_CARE_PROVIDER_SITE_OTHER): Payer: 59 | Admitting: Psychiatry

## 2021-02-14 ENCOUNTER — Encounter: Payer: Self-pay | Admitting: Psychiatry

## 2021-02-14 ENCOUNTER — Other Ambulatory Visit: Payer: Self-pay

## 2021-02-14 DIAGNOSIS — F4323 Adjustment disorder with mixed anxiety and depressed mood: Secondary | ICD-10-CM | POA: Diagnosis not present

## 2021-02-14 NOTE — Progress Notes (Signed)
      [facility] Counselor/Therapist Progress Note  Patient ID: Alan Bauer, MRN: [id number],    Date: 02/14/2021  Time Spent: 45 minutes   Treatment Type: Individual Therapy  Reported Symptoms: anxious, sad  Mental Status Exam:  Appearance:   Casual     Behavior:  Appropriate  Motor:  Normal  Speech/Language:   Clear and Coherent  Affect:  Appropriate  Mood:  anxious and sad  Thought process:  normal  Thought content:    WNL  Sensory/Perceptual disturbances:    WNL  Orientation:  oriented to person, place, time/date and situation  Attention:  Good  Concentration:  Good  Memory:  WNL  Fund of knowledge:   Good  Insight:    Good  Judgment:   Good  Impulse Control:  Good   Risk Assessment: Danger to Self:  No Self-injurious Behavior: No Danger to Others: No Duty to Warn:no Physical Aggression / Violence:No  Access to Firearms a concern: No  Gang Involvement:No   Subjective: The client states today that he is sad and anxious over what is going on with a couple that he and his wife are friends with.  It turns out that the husband has been having an affair for the past 5 years.  It has made everything so difficult for the client and his wife.  He states they debrief regularly on what has been happening. The client had an interview this past Tuesday with the logistics company.  "It was the best interview I had."  It lasted much longer than he expected.  He plans on following up today to see what his next steps are.  So far the client has been fairly calm with not working.  He gets his severance until June 15.  He is a little stressed that he will need to figure out insurance soon.  He has been focused on increasing his self-care.  He has been walking regularly and playing his guitar.  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  The client's father-in-law was traveling out of state to see family.  He developed a life-threatening illness but is now out of the hospital returning to  [place].  This relationship has been very complicated for his wife.  The client has focused on trying to be supportive going forward.  I have discussed with the client the fact that I will be retiring at the end of April.  He has 1 more appointment.  I have mentioned to him if he would like someone to follow-up with that I would suggest Elio Forget, [facility].  We will discuss that in more detail at next session.  Interventions: Assertiveness/Communication, Motivational Interviewing, Solution-Oriented/Positive Psychology and Insight-Oriented  Diagnosis:   ICD-10-CM   1. Adjustment disorder with mixed anxiety and depressed mood  F43.23     Plan: Self-care, positive self talk, boundaries, job search, follow-up on interviews, engaged activities.  Gelene Mink Dariyah Garduno, [facility]

## 2021-02-22 ENCOUNTER — Ambulatory Visit (INDEPENDENT_AMBULATORY_CARE_PROVIDER_SITE_OTHER): Payer: 59 | Admitting: Psychiatry

## 2021-02-22 ENCOUNTER — Other Ambulatory Visit: Payer: Self-pay

## 2021-02-22 ENCOUNTER — Encounter: Payer: Self-pay | Admitting: Psychiatry

## 2021-02-22 DIAGNOSIS — F4323 Adjustment disorder with mixed anxiety and depressed mood: Secondary | ICD-10-CM | POA: Diagnosis not present

## 2021-02-22 NOTE — Progress Notes (Signed)
      [facility] Counselor/Therapist Progress Note  Patient ID: Alan Bauer, MRN: [id number],    Date: 02/22/2021  Time Spent: 50 minutes   Treatment Type: Individual Therapy  Reported Symptoms: anxious, irritated  Mental Status Exam:  Appearance:   Casual     Behavior:  Appropriate  Motor:  Normal  Speech/Language:   Clear and Coherent  Affect:  Appropriate  Mood:  anxious and irritable  Thought process:  normal  Thought content:    WNL  Sensory/Perceptual disturbances:    WNL  Orientation:  oriented to person, place, time/date and situation  Attention:  Good  Concentration:  Good  Memory:  WNL  Fund of knowledge:   Good  Insight:    Good  Judgment:   Good  Impulse Control:  Good   Risk Assessment: Danger to Self:  No Self-injurious Behavior: No Danger to Others: No Duty to Warn:no Physical Aggression / Violence:No  Access to Firearms a concern: No  Gang Involvement:No   Subjective: The client states that his wife is out of town for the weekend.  He finds himself more aggravated trying to take care of the 3 children.  He is glad that his wife is getting some rest. Today we focused on the client's aggravation and irritability.  His negative cognition is, "the things that are expected of me."  His subjective units of distress is a 5.  He feels it in his chest.  As the client processed he noted that some of this is because of the rhythm of the last month.  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  He did have a job offer but it was not enough income for his family.  He is not discouraged but will continue to focus on sending out resumes.  He discussed that his oldest son, who is almost 5, has been acting out and throwing temper tantrums.  The client has found it very frustrating to work with his son.  The client states his son throws things and breaks stuff.  I discussed that maybe the client could get a throwing box  which is a box full of things that he can safely throw.  The client will consider this.  We also discussed some behavioral interventions such as offering rewards for not screaming.  As the client continued to process his subjective units of distress was down to a 2+.  He feels he has some good ideas about how to manage his children more effectively. I discussed with the client that I was retiring.  The client has the list of therapists in this office and in the community.  He states today that he is going to search out someone on his own.  Services ended by mutual consent.  Interventions: Motivational Interviewing, Solution-Oriented/Positive Psychology, [employer] Desensitization and Reprocessing (EMDR) and Insight-Oriented  Diagnosis:   ICD-10-CM   1. Adjustment disorder with mixed anxiety and depressed mood  F43.23     Plan: Mood independent behavior, positive self talk, behavioral interventions with son, continue to apply for new jobs, self-care.  Gelene Mink Jocee Kissick, [facility]
